# Patient Record
Sex: Female | Born: 1960 | Race: White | Hispanic: No | Marital: Married | Smoking: Former smoker
Health system: Southern US, Community
[De-identification: ages and names within clinical notes are randomized; demographics above are authoritative.]

## PROBLEM LIST (undated history)

## (undated) DIAGNOSIS — F419 Anxiety disorder, unspecified: Secondary | ICD-10-CM

## (undated) DIAGNOSIS — F329 Major depressive disorder, single episode, unspecified: Secondary | ICD-10-CM

## (undated) DIAGNOSIS — F32A Depression, unspecified: Secondary | ICD-10-CM

## (undated) DIAGNOSIS — E785 Hyperlipidemia, unspecified: Secondary | ICD-10-CM

## (undated) HISTORY — DX: Major depressive disorder, single episode, unspecified: F32.9

## (undated) HISTORY — DX: Anxiety disorder, unspecified: F41.9

## (undated) HISTORY — PX: OTHER SURGICAL HISTORY: SHX169

## (undated) HISTORY — DX: Depression, unspecified: F32.A

## (undated) HISTORY — PX: LIPOSUCTION: SHX10

## (undated) HISTORY — DX: Hyperlipidemia, unspecified: E78.5

## (undated) HISTORY — PX: MASTOPEXY: SUR857

---

## 2003-02-04 HISTORY — PX: OTHER SURGICAL HISTORY: SHX169

## 2004-02-04 HISTORY — PX: BLEPHAROPLASTY: SUR158

## 2009-10-20 ENCOUNTER — Emergency Department (HOSPITAL_COMMUNITY): Admission: EM | Admit: 2009-10-20 | Discharge: 2009-10-20 | Payer: Self-pay | Admitting: Family Medicine

## 2012-02-04 HISTORY — PX: COLONOSCOPY: SHX5424

## 2012-06-02 ENCOUNTER — Other Ambulatory Visit: Payer: Self-pay | Admitting: Family Medicine

## 2012-09-14 ENCOUNTER — Telehealth: Payer: Self-pay | Admitting: Physician Assistant

## 2012-09-14 NOTE — Telephone Encounter (Signed)
Called patient.  States due for colonoscopy and wanted to know who we recommend.  Told multiple GI offices in area and associated with [facility] that we can refer to.  Also discussed large cyst on side and who could remove that.  Again told good surgeons in area to refer to.  BUT she would need a referral and we would have to sent recent notes/lab work to both to schedule those appts.  Pt to call back and schedule appt when available.

## 2012-09-22 ENCOUNTER — Ambulatory Visit (INDEPENDENT_AMBULATORY_CARE_PROVIDER_SITE_OTHER): Payer: Managed Care, Other (non HMO) | Admitting: Physician Assistant

## 2012-09-22 ENCOUNTER — Encounter: Payer: Self-pay | Admitting: Physician Assistant

## 2012-09-22 ENCOUNTER — Ambulatory Visit
Admission: RE | Admit: 2012-09-22 | Discharge: 2012-09-22 | Disposition: A | Discharge status: Home / Self Care | Payer: Managed Care, Other (non HMO) | Source: Ambulatory Visit | Attending: Physician Assistant | Admitting: Physician Assistant

## 2012-09-22 VITALS — BP 122/90 | HR 76 | Temp 98.3°F | Resp 18 | Ht 63.0 in | Wt 120.0 lb

## 2012-09-22 DIAGNOSIS — M545 Low back pain: Secondary | ICD-10-CM

## 2012-09-22 DIAGNOSIS — L720 Epidermal cyst: Secondary | ICD-10-CM

## 2012-09-22 DIAGNOSIS — Z1211 Encounter for screening for malignant neoplasm of colon: Secondary | ICD-10-CM

## 2012-09-22 DIAGNOSIS — L723 Sebaceous cyst: Secondary | ICD-10-CM

## 2012-09-22 DIAGNOSIS — F329 Major depressive disorder, single episode, unspecified: Secondary | ICD-10-CM | POA: Insufficient documentation

## 2012-09-22 NOTE — Progress Notes (Signed)
Patient ID: Kelly Joseph MRN: [id number], DOB: 14-Jun-1960, 52 y.o. Date of Encounter: 09/22/2012, 2:39 PM    Chief Complaint: 52  Chief Complaint  Patient presents with  . due colonoscopy, knot on left side    back pain "alot of popping"  has had but getting worse     HPI: 52 y.o. year old white female here to evaluate several issues.  #1-she reports that she has had a history of spasms in her low back for many years. She has used Flexeril as needed for these over the years. However she is concerned because these spasms are occurring more frequently recently. As well they seem to be occurring for no known cause. She says this past Sunday she was walking into church and all of a sudden had a grabbing tight sensation in her both sides of her low back. She took a Flexeril and about 30 minutes later the spasm had eased off. She says she does aerobics video at home as her exercise. No other type of exercise. However she says that she does not have any significant back pain during the time of the exercise. As well she says that she can be cleaning the house crawling around wiping the molding Melvern Banker. She has no problems with back pain her back spasms with this. She has had no pain no numbness no tingling no weakness going down either leg.  2- at her last physical I told her that when she turned 52 she would be due for colonoscopy. She is now 52 she is interested in scheduling for this.  3- she has had a cyst under the skin on her left flank for many years. This is starting to bother her and she wants to have this removed.   Home Meds: See attached medication section for any medications that were entered at today's visit. The computer does not put those onto this list.The following list is a list of meds entered prior to today's visit.   Current Outpatient Prescriptions on File Prior to Visit  Medication Sig Dispense Refill  . fluticasone (FLONASE) 50 MCG/ACT nasal spray USE 1 SPRAY IN EACH  NOSTRIL EVERY DAY  16 g  11   No current facility-administered medications on file prior to visit.    Allergies:  Allergies  Allergen Reactions  . Latex Hives  . Sulfa Antibiotics Hives      Review of Systems: See HPI for pertinent ROS. All other ROS negative.    Physical Exam: Blood pressure 122/90, pulse 76, temperature 98.3 F (36.8 C), temperature source Oral, resp. rate 18, height 5\' 3"  (1.6 m), weight 120 lb (54.432 kg)., Body mass index is 21.26 kg/(m^2). General: well-nourished well-developed white female. She has had multiple plastic surgeries and works as a Associate Professor. Appears in no acute distress. Neck: Supple. No thyromegaly. No lymphadenopathy. Lungs: Clear bilaterally to auscultation without wheezes, rales, or rhonchi. Breathing is unlabored. Heart: Regular rhythm. No murmurs, rubs, or gallops. Msk:  Strength and tone normal for age. Back: She points to bilateral lower back at approximate L4-L5 level as the area of her pain and spasms. There is only mild pain with palpation. Bilateral straight leg raise and hip abductions are normal. 2+ equal bilateral patellar reflexes. Skin: Warm and dry. Left flank at the level of the lower rib cage: There is an approximate 1 inch diameter cyst under the skin. There is no erythema to suggest infection or inflammation. Neuro: Alert and oriented X 3. Moves all extremities spontaneously. Gait  is normal. CNII-XII grossly in tact. Psych:  Responds to questions appropriately with a normal affect.     ASSESSMENT AND PLAN:  52 y.o. year old female with  1. Colon cancer screening - Ambulatory referral to Gastroenterology  2. Low back pain Will obtain x-ray to rule out pathology. Otherwise continue using Flexeril when necessary back spasms. She is to stretch with her exercise. - DG Lumbar Spine Complete; Future  3. Epidermoid cyst of skin Will refer for excision.   Murray Hodgkins [place], [place], [facility] 09/22/2012 2:39 PM

## 2012-09-23 ENCOUNTER — Encounter: Payer: Self-pay | Admitting: Internal Medicine

## 2012-09-29 ENCOUNTER — Ambulatory Visit (INDEPENDENT_AMBULATORY_CARE_PROVIDER_SITE_OTHER): Payer: Managed Care, Other (non HMO) | Admitting: General Surgery

## 2012-10-14 ENCOUNTER — Ambulatory Visit (INDEPENDENT_AMBULATORY_CARE_PROVIDER_SITE_OTHER): Payer: Managed Care, Other (non HMO) | Admitting: General Surgery

## 2012-10-14 ENCOUNTER — Encounter (INDEPENDENT_AMBULATORY_CARE_PROVIDER_SITE_OTHER): Payer: Self-pay | Admitting: General Surgery

## 2012-10-14 VITALS — BP 124/70 | HR 70 | Temp 98.0°F | Resp 18 | Ht 63.0 in | Wt 118.0 lb

## 2012-10-14 DIAGNOSIS — D179 Benign lipomatous neoplasm, unspecified: Secondary | ICD-10-CM

## 2012-10-14 NOTE — Patient Instructions (Addendum)

## 2012-10-14 NOTE — Progress Notes (Signed)
Chief complaint: Enlarging lump left flank  History: Patient is a pleasant 52 year old female referred by Allayne Butcher PA for an enlarging lump on her left flank. She states this has been present for several years but has been gradually enlarging. It is now causing some occasional mild discomfort. She has no other similar areas. She's never had surgery on this.  Past Medical History  Diagnosis Date  . Anxiety   . Depression    Past Surgical History  Procedure Laterality Date  . Mastopexy  1989/1999  . Liposuction  1999/2009  . Blepharoplasty  2006  . Resty    . Restylane  2005   Current Outpatient Prescriptions  Medication Sig Dispense Refill  . ALPRAZolam (XANAX) 0.25 MG tablet Take 0.25 mg by mouth daily as needed for anxiety.      Marland Kitchen buPROPion (WELLBUTRIN XL) 150 MG 24 hr tablet Take 1 tablet by mouth daily. Takes with 300mg  to equal 450mg  a day      . buPROPion (WELLBUTRIN XL) 300 MG 24 hr tablet Take 300 mg by mouth daily. Takes with 150 to equal 450mg  daily      . cetirizine (ZYRTEC) 10 MG tablet Take 10 mg by mouth daily.      . cyclobenzaprine (FLEXERIL) 10 MG tablet Take 10 mg by mouth 3 (three) times daily as needed for muscle spasms.      . Flaxseed, Linseed, (FLAX SEEDS PO) Take by mouth.      . fluticasone (FLONASE) 50 MCG/ACT nasal spray USE 1 SPRAY IN EACH NOSTRIL EVERY DAY  16 g  11  . Multiple Vitamin (MULTIVITAMIN) tablet Take 1 tablet by mouth daily.      Marland Kitchen omega-3 acid ethyl esters (LOVAZA) 1 G capsule Take 2 g by mouth 2 (two) times daily.       No current facility-administered medications for this visit.   Allergies  Allergen Reactions  . Latex Hives  . Sulfa Antibiotics Hives   History  Substance Use Topics  . Smoking status: Former Smoker    Quit date: 09/23/1982  . Smokeless tobacco: Never Used  . Alcohol Use: No   Exam: BP 124/70  Pulse 70  Temp(Src) 98 F (36.7 C)  Resp 18  Ht 5\' 3"  (1.6 m)  Wt 118 lb (53.524 kg)  BMI 20.91 kg/m2 General:  Thin Caucasian female in no distress Skin: No rash or infection Lungs: Clear breath sounds bilaterally Chest wall: On the left lateral lower chest wall is a 4 x 2 cm discrete fleshy freely movable soft tissue mass consistent with lipoma. Cardiac: Regular rate and rhythm no murmurs Extremities: No edema or joint swelling Neurologic: Alert and fully oriented. Affect normal. Gait normal.  Assessment and plan: Enlarging symptomatic lipoma left flank. I recommended removal under local anesthesia with sedation as an outpatient. We discussed the nature of the procedure and indications as well as risks of bleeding, infection, anesthetic complications, and recurrence. All her questions were answered. We will schedule this at her convenience.

## 2012-11-02 ENCOUNTER — Encounter (INDEPENDENT_AMBULATORY_CARE_PROVIDER_SITE_OTHER): Payer: Self-pay

## 2012-11-26 ENCOUNTER — Ambulatory Visit (AMBULATORY_SURGERY_CENTER): Payer: Self-pay

## 2012-11-26 VITALS — Ht 64.0 in | Wt 125.0 lb

## 2012-11-26 DIAGNOSIS — Z1211 Encounter for screening for malignant neoplasm of colon: Secondary | ICD-10-CM

## 2012-11-26 MED ORDER — SUPREP BOWEL PREP KIT 17.5-3.13-1.6 GM/177ML PO SOLN: 1.0000 | Freq: Once | ORAL | Status: DC

## 2012-12-01 ENCOUNTER — Encounter: Payer: Self-pay | Admitting: Internal Medicine

## 2012-12-10 ENCOUNTER — Ambulatory Visit (AMBULATORY_SURGERY_CENTER): Payer: Managed Care, Other (non HMO) | Admitting: Internal Medicine

## 2012-12-10 ENCOUNTER — Encounter: Payer: Self-pay | Admitting: Internal Medicine

## 2012-12-10 VITALS — BP 126/79 | HR 63 | Temp 95.7°F | Resp 14 | Ht 64.0 in | Wt 125.0 lb

## 2012-12-10 DIAGNOSIS — Z1211 Encounter for screening for malignant neoplasm of colon: Secondary | ICD-10-CM

## 2012-12-10 MED ORDER — SODIUM CHLORIDE 0.9 % IV SOLN: 500.0000 mL | INTRAVENOUS | Status: DC

## 2012-12-10 NOTE — Progress Notes (Signed)
Report to pacu rn, vss, bbs=clear 

## 2012-12-10 NOTE — Progress Notes (Signed)
Patient did not have preoperative order for IV antibiotic SSI prophylaxis. (G8918)  Patient did not experience any of the following events: a burn prior to discharge; a fall within the facility; wrong site/side/patient/procedure/implant event; or a hospital transfer or hospital admission upon discharge from the facility. (G8907)  

## 2012-12-10 NOTE — Patient Instructions (Addendum)
Your colon was normal and the prep was great!  Next routine colonoscopy in 10 years - 2024.  It is the time of year to have a vaccination to prevent the flu (influenza virus). Please have this done through your primary care provider or you can get this done at local pharmacies or the [facility]. It would be very helpful if you notify your primary care provider when and where you had the vaccination given by messaging them in My Chart, leaving a message or faxing the information.  I appreciate the opportunity to care for you. Iva Boop, MD, FACG  YOU HAD AN ENDOSCOPIC PROCEDURE TODAY AT THE [facility]: Refer to the procedure report that was given to you for any specific questions about what was found during the examination.  If the procedure report does not answer your questions, please call your gastroenterologist to clarify.  If you requested that your care partner not be given the details of your procedure findings, then the procedure report has been included in a sealed envelope for you to review at your convenience later.  YOU SHOULD EXPECT: Some feelings of bloating in the abdomen. Passage of more gas than usual.  Walking can help get rid of the air that was put into your GI tract during the procedure and reduce the bloating. If you had a lower endoscopy (such as a colonoscopy or flexible sigmoidoscopy) you may notice spotting of blood in your stool or on the toilet paper. If you underwent a bowel prep for your procedure, then you may not have a normal bowel movement for a few days.  DIET: Your first meal following the procedure should be a light meal and then it is ok to progress to your normal diet.  A half-sandwich or bowl of soup is an example of a good first meal.  Heavy or fried foods are harder to digest and may make you feel nauseous or bloated.  Likewise meals heavy in dairy and vegetables can cause extra gas to form and this can also increase the bloating.  Drink  plenty of fluids but you should avoid alcoholic beverages for 24 hours.  ACTIVITY: Your care partner should take you home directly after the procedure.  You should plan to take it easy, moving slowly for the rest of the day.  You can resume normal activity the day after the procedure however you should NOT DRIVE or use heavy machinery for 24 hours (because of the sedation medicines used during the test).    SYMPTOMS TO REPORT IMMEDIATELY: A gastroenterologist can be reached at any hour.  During normal business hours, 8:30 AM to 5:00 PM Monday through Friday, call [phone].  After hours and on weekends, please call the GI answering service at [phone] who will take a message and have the physician on call contact you.   Following lower endoscopy (colonoscopy or flexible sigmoidoscopy):  Excessive amounts of blood in the stool  Significant tenderness or worsening of abdominal pains  Swelling of the abdomen that is new, acute  Fever of 100F or higher  FOLLOW UP: If any biopsies were taken you will be contacted by phone or by letter within the next 1-3 weeks.  Call your gastroenterologist if you have not heard about the biopsies in 3 weeks.  Our staff will call the home number listed on your records the next business day following your procedure to check on you and address any questions or concerns that you may have  at that time regarding the information given to you following your procedure. This is a courtesy call and so if there is no answer at the home number and we have not heard from you through the emergency physician on call, we will assume that you have returned to your regular daily activities without incident.  SIGNATURES/CONFIDENTIALITY: You and/or your care partner have signed paperwork which will be entered into your electronic medical record.  These signatures attest to the fact that that the information above on your After Visit Summary has been reviewed and is understood.   Full responsibility of the confidentiality of this discharge information lies with you and/or your care-partner.

## 2012-12-10 NOTE — Op Note (Signed)
[facility] [address] [place], [address]   COLONOSCOPY PROCEDURE REPORT  PATIENT: Kelly Joseph, Kelly Joseph  MR#: [id number] BIRTHDATE: 1960-03-14 , 52  yrs. old GENDER: Female ENDOSCOPIST: Iva Boop, MD, [facility] REFERRED [id number] Dionicio Stall, PA-C PROCEDURE DATE:  12/10/2012 PROCEDURE:   Colonoscopy, screening First Screening Colonoscopy - Avg.  risk and is 50 yrs.  old or older - No.  Prior Negative Screening - Now for repeat screening. N/A  History of Adenoma - Now for follow-up colonoscopy & has been > or = to 3 yrs.  N/A  Polyps Removed Today? No.  Recommend repeat exam, <10 yrs? No. ASA CLASS:   Class II INDICATIONS:average risk screening and first colonoscopy. MEDICATIONS: propofol (Diprivan) 300mg  IV, MAC sedation, administered by CRNA, and These medications were titrated to patient response per physician's verbal order  DESCRIPTION OF PROCEDURE:   After the risks benefits and alternatives of the procedure were thoroughly explained, informed consent was obtained.  A digital rectal exam revealed no abnormalities of the rectum.   The LB [id number]  endoscope was introduced through the anus and advanced to the cecum, which was identified by both the appendix and ileocecal valve. No adverse events experienced.   The quality of the prep was excellent using Suprep  The instrument was then slowly withdrawn as the colon was fully examined.      COLON FINDINGS: A normal appearing cecum, ileocecal valve, and appendiceal orifice were identified.  The ascending, hepatic flexure, transverse, splenic flexure, descending, sigmoid colon and rectum appeared unremarkable.  No polyps or cancers were seen. Retroflexed views revealed no abnormalities. The time to cecum=2 minutes 34 seconds.  Withdrawal time=8 minutes 04 seconds.  The scope was withdrawn and the procedure completed. COMPLICATIONS: There were no complications.  ENDOSCOPIC IMPRESSION: Normal  colonoscopy- excellent prep - first colonoscopy  RECOMMENDATIONS: Repeat routine colonoscopy 10 years - 2024   eSigned:  Iva Boop, MD, [facility] 12/10/2012 9:07 AM   cc: Frazier Richards PA-C and The Patient

## 2012-12-13 ENCOUNTER — Telehealth: Payer: Self-pay

## 2012-12-13 NOTE — Telephone Encounter (Signed)
  Follow up Call-  Call back number 12/10/2012  Post procedure Call Back phone  # [phone]  Permission to leave phone message Yes     Patient questions:  Do you have a fever, pain , or abdominal swelling? no Pain Score  0 *  Have you tolerated food without any problems? yes  Have you been able to return to your normal activities? yes  Do you have any questions about your discharge instructions: Diet   no Medications  no Follow up visit  no  Do you have questions or concerns about your Care? no  Actions: * If pain score is 4 or above: No action needed, pain <4.

## 2013-01-30 ENCOUNTER — Other Ambulatory Visit: Payer: Self-pay | Admitting: Physician Assistant

## 2013-01-31 NOTE — Telephone Encounter (Signed)
Medication refilled per protocol. 

## 2013-02-01 ENCOUNTER — Telehealth: Payer: Self-pay | Admitting: Physician Assistant

## 2013-02-01 NOTE — Telephone Encounter (Signed)
Pt is needing a refill both Wellbutrin needs to be refilled Pharmacy [facility] Call back number is  [phone]

## 2013-02-01 NOTE — Telephone Encounter (Signed)
Med was refilled 12/28. Pharm called and confirmed

## 2013-02-14 ENCOUNTER — Other Ambulatory Visit: Payer: Managed Care, Other (non HMO)

## 2013-02-14 DIAGNOSIS — Z0000 Encounter for general adult medical examination without abnormal findings: Secondary | ICD-10-CM

## 2013-02-14 DIAGNOSIS — Z79899 Other long term (current) drug therapy: Secondary | ICD-10-CM

## 2013-02-14 DIAGNOSIS — E785 Hyperlipidemia, unspecified: Secondary | ICD-10-CM

## 2013-02-14 LAB — LIPID PANEL
Cholesterol: 268 mg/dL — ABNORMAL HIGH (ref 0–200)
HDL: 67 mg/dL (ref >39)
LDL CALC: 184 mg/dL — AB (ref 0–99)
Total CHOL/HDL Ratio: 4 Ratio
Triglycerides: 85 mg/dL (ref <150)
VLDL: 17 mg/dL (ref 0–40)

## 2013-02-14 LAB — COMPLETE METABOLIC PANEL WITH GFR
ALBUMIN: 4.3 g/dL (ref 3.5–5.2)
ALT: 14 U/L (ref 0–35)
AST: 14 U/L (ref 0–37)
Alkaline Phosphatase: 41 U/L (ref 39–117)
BUN: 10 mg/dL (ref 6–23)
CALCIUM: 9.3 mg/dL (ref 8.4–10.5)
CHLORIDE: 103 meq/L (ref 96–112)
CO2: 28 mEq/L (ref 19–32)
CREATININE: 0.78 mg/dL (ref 0.50–1.10)
GFR, Est African American: >89 mL/min
GFR, Est Non African American: 89 mL/min
Glucose, Bld: 90 mg/dL (ref 70–99)
POTASSIUM: 4.8 meq/L (ref 3.5–5.3)
Sodium: 140 mEq/L (ref 135–145)
Total Bilirubin: 0.4 mg/dL (ref 0.3–1.2)
Total Protein: 6.7 g/dL (ref 6.0–8.3)

## 2013-02-14 LAB — CBC WITH DIFFERENTIAL/PLATELET
Basophils Absolute: 0.1 10*3/uL (ref 0.0–0.1)
Basophils Relative: 1 % (ref 0–1)
Eosinophils Absolute: 0.3 10*3/uL (ref 0.0–0.7)
Eosinophils Relative: 5 % (ref 0–5)
HCT: 41.6 % (ref 36.0–46.0)
HEMOGLOBIN: 14.2 g/dL (ref 12.0–15.0)
LYMPHS ABS: 2.4 10*3/uL (ref 0.7–4.0)
LYMPHS PCT: 46 % (ref 12–46)
MCH: 30.1 pg (ref 26.0–34.0)
MCHC: 34.1 g/dL (ref 30.0–36.0)
MCV: 88.1 fL (ref 78.0–100.0)
MONOS PCT: 8 % (ref 3–12)
Monocytes Absolute: 0.4 10*3/uL (ref 0.1–1.0)
NEUTROS ABS: 2 10*3/uL (ref 1.7–7.7)
NEUTROS PCT: 40 % — AB (ref 43–77)
Platelets: 352 10*3/uL (ref 150–400)
RBC: 4.72 MIL/uL (ref 3.87–5.11)
RDW: 13.8 % (ref 11.5–15.5)
WBC: 5.1 10*3/uL (ref 4.0–10.5)

## 2013-02-14 LAB — TSH: TSH: 1.447 u[IU]/mL (ref 0.350–4.500)

## 2013-02-15 ENCOUNTER — Encounter: Payer: Managed Care, Other (non HMO) | Admitting: Physician Assistant

## 2013-02-15 LAB — VITAMIN D 25 HYDROXY (VIT D DEFICIENCY, FRACTURES): VIT D 25 HYDROXY: 41 ng/mL (ref 30–89)

## 2013-02-24 ENCOUNTER — Encounter: Payer: Managed Care, Other (non HMO) | Admitting: Physician Assistant

## 2013-02-28 ENCOUNTER — Encounter: Payer: Self-pay | Admitting: Family Medicine

## 2013-03-14 ENCOUNTER — Ambulatory Visit (INDEPENDENT_AMBULATORY_CARE_PROVIDER_SITE_OTHER): Payer: Managed Care, Other (non HMO) | Admitting: Physician Assistant

## 2013-03-14 ENCOUNTER — Other Ambulatory Visit: Payer: Self-pay | Admitting: Family Medicine

## 2013-03-14 ENCOUNTER — Encounter: Payer: Self-pay | Admitting: Physician Assistant

## 2013-03-14 VITALS — BP 124/72 | HR 76 | Temp 97.6°F | Resp 18 | Ht 63.0 in | Wt 122.0 lb

## 2013-03-14 DIAGNOSIS — M549 Dorsalgia, unspecified: Secondary | ICD-10-CM | POA: Insufficient documentation

## 2013-03-14 DIAGNOSIS — Z0000 Encounter for general adult medical examination without abnormal findings: Secondary | ICD-10-CM

## 2013-03-14 DIAGNOSIS — F329 Major depressive disorder, single episode, unspecified: Secondary | ICD-10-CM

## 2013-03-14 DIAGNOSIS — E785 Hyperlipidemia, unspecified: Secondary | ICD-10-CM | POA: Insufficient documentation

## 2013-03-14 DIAGNOSIS — F419 Anxiety disorder, unspecified: Secondary | ICD-10-CM

## 2013-03-14 DIAGNOSIS — J309 Allergic rhinitis, unspecified: Secondary | ICD-10-CM

## 2013-03-14 DIAGNOSIS — F32A Depression, unspecified: Secondary | ICD-10-CM

## 2013-03-14 DIAGNOSIS — F3289 Other specified depressive episodes: Secondary | ICD-10-CM

## 2013-03-14 DIAGNOSIS — F411 Generalized anxiety disorder: Secondary | ICD-10-CM

## 2013-03-14 HISTORY — DX: Hyperlipidemia, unspecified: E78.5

## 2013-03-14 MED ORDER — CYCLOBENZAPRINE HCL 10 MG PO TABS: 10.0000 mg | ORAL_TABLET | Freq: Three times a day (TID) | ORAL | Status: DC | PRN

## 2013-03-14 MED ORDER — SIMVASTATIN 10 MG PO TABS: 10.0000 mg | ORAL_TABLET | Freq: Every day | ORAL | Status: DC

## 2013-03-14 MED ORDER — BUPROPION HCL ER (XL) 300 MG PO TB24: 300.0000 mg | ORAL_TABLET | Freq: Every day | ORAL | Status: DC

## 2013-03-14 MED ORDER — BUPROPION HCL ER (XL) 150 MG PO TB24: 150.0000 mg | ORAL_TABLET | Freq: Every day | ORAL | Status: DC

## 2013-03-14 MED ORDER — FLUTICASONE PROPIONATE 50 MCG/ACT NA SUSP: 2.0000 | Freq: Every day | NASAL | Status: DC

## 2013-03-14 NOTE — Telephone Encounter (Signed)
Medication refilled per protocol. 

## 2013-03-14 NOTE — Progress Notes (Signed)
Patient ID: Floetta Wells-Fowler MRN: [id number], DOB: 1960/12/03, 53 y.o. Date of Encounter: 03/14/2013,   Chief Complaint: Physical (CPE)  HPI: 53 y.o. y/o female  here for CPE.   When She was here last year for her physical she requested that we increase her Wellbutrin dose as she was often feeling overwhelmed. She says that this has worked very well for her. Feeling much better with this increased dose. Having no adverse effects. Rarely needs the Xanax but does like to have it on hand for when needed.  As well would like a refill on her Flexeril to still have available as needed for some back pain that is secondary to muscle strain.  She also continues to use Zyrtec and Flonase for allergies.  He continues to exercise 5 or 6 days per week at home. She does different types of videos for aerobics and does some weight lifting as well. As well she is very cautious about her diet and eats a very healthy diet. Has no angina symptoms with her exercise.   Review of Systems: Consitutional: No fever, chills, fatigue, night sweats, lymphadenopathy. No significant/unexplained weight changes. Eyes: No visual changes, eye redness, or discharge. ENT/Mouth: No ear pain, sore throat, nasal drainage, or sinus pain. Cardiovascular: No chest pressure,heaviness, tightness or squeezing, even with exertion. No increased shortness of breath or dyspnea on exertion.No palpitations, edema, orthopnea, PND. Respiratory: No cough, hemoptysis, SOB, or wheezing. Gastrointestinal: No anorexia, dysphagia, reflux, pain, nausea, vomiting, hematemesis, diarrhea, constipation, BRBPR, or melena. Breast: No mass, nodules, bulging, or retraction. No skin changes or inflammation. No nipple discharge. No lymphadenopathy. Genitourinary: No dysuria, hematuria, incontinence, vaginal discharge, pruritis, burning, abnormal bleeding, or pain. Musculoskeletal: No decreased ROM, No joint pain or swelling. No significant pain in neck,  back, or extremities. Skin: No rash, pruritis, or concerning lesions. Neurological: No headache, dizziness, syncope, seizures, tremors, memory loss, coordination problems, or paresthesias. Psychological: No anxiety, depression, hallucinations, SI/HI. Endocrine: No polydipsia, polyphagia, polyuria, or known diabetes.No increased fatigue. No palpitations/rapid heart rate. No significant/unexplained weight change. All other systems were reviewed and are otherwise negative.  Past Medical History  Diagnosis Date  . Anxiety   . Depression   . Hyperlipidemia 03/14/2013     Past Surgical History  Procedure Laterality Date  . Mastopexy  1989/1999  . Liposuction  1999/2009  . Blepharoplasty  2006  . Resty    . Restylane  2005    Home Meds:  Current Outpatient Prescriptions on File Prior to Visit  Medication Sig Dispense Refill  . ALPRAZolam (XANAX) 0.25 MG tablet Take 0.25 mg by mouth daily as needed for anxiety.      . cetirizine (ZYRTEC) 10 MG tablet Take 10 mg by mouth daily.      . Flaxseed, Linseed, (FLAX SEEDS PO) Take by mouth.      . Multiple Vitamin (MULTIVITAMIN) tablet Take 1 tablet by mouth daily.      Marland Kitchen omega-3 acid ethyl esters (LOVAZA) 1 G capsule Take 2 g by mouth 2 (two) times daily.       No current facility-administered medications on file prior to visit.    Allergies:  Allergies  Allergen Reactions  . Latex Hives  . Sulfa Antibiotics Hives    History   Social History  . Marital Status: Married    Spouse Name: N/A    Number of Children: N/A  . Years of Education: N/A   Occupational History  . Not on file.   Social History  Main Topics  . Smoking status: Former Smoker    Quit date: 09/23/1982  . Smokeless tobacco: Never Used  . Alcohol Use: Yes     Comment: socialy  . Drug Use: No  . Sexual Activity: Not on file   Other Topics Concern  . Not on file   Social History Narrative   Cosmetologist.   Exercises at home 5-6 days per week   No children      Family History  Problem Relation Age of Onset  . Cancer Mother     breast ca  . Cancer Maternal Grandfather     ovarian ca  . Colon cancer Neg Hx   . Pancreatic cancer Neg Hx   . Stomach cancer Neg Hx     Physical Exam: Blood pressure 124/72, pulse 76, temperature 97.6 F (36.4 C), temperature source Oral, resp. rate 18, height _0  (1.6 m), weight 122 lb (55.339 kg)., Body mass index is 21.62 kg/(m^2). General: Well developed, well nourished,WF. Appears in no acute distress. HEENT: Normocephalic, atraumatic. Conjunctiva pink, sclera non-icteric. Pupils 2 mm constricting to 1 mm, round, regular, and equally reactive to light and accomodation. EOMI. Internal auditory canal clear. TMs with good cone of light and without pathology. Nasal mucosa pink. Nares are without discharge. No sinus tenderness. Oral mucosa pink.  Pharynx without exudate.   Neck: Supple. Trachea midline. No thyromegaly. Full ROM. No lymphadenopathy.No Carotid Bruits. Lungs: Clear to auscultation bilaterally without wheezes, rales, or rhonchi. Breathing is of normal effort and unlabored. Cardiovascular: RRR with S1 S2. No murmurs, rubs, or gallops. Distal pulses 2+ symmetrically. No carotid or abdominal bruits. Breast: Symmetrical. No masses. Nipples without discharge. Abdomen: Soft, non-tender, non-distended with normoactive bowel sounds. No hepatosplenomegaly or masses. No rebound/guarding. No CVA tenderness. No hernias.  Genitourinary:  External genitalia without lesions. Vaginal mucosa pink.No discharge present. Cervix pink and without discharge. No cervical tenderness.Normal uterus size. No adnexal mass or tenderness.  Musculoskeletal: Full range of motion and 5/5 strength throughout. Without swelling, atrophy, tenderness, crepitus, or warmth. Extremities without clubbing, cyanosis, or edema. Calves supple. Skin: Warm and moist without erythema, ecchymosis, wounds, or rash. Neuro: A+Ox3. CN II-XII grossly intact.  Moves all extremities spontaneously. Full sensation throughout. Normal gait. DTR 2+ throughout upper and lower extremities. Finger to nose intact. Psych:  Responds to questions appropriately with a normal affect.   Assessment/Plan:  53 y.o. y/o female here for CPE 1. Encounter for preventive health examination   A. Screening Labs: Results for orders placed in visit on 02/14/13  LIPID PANEL      Result Value Range   Cholesterol 268 (*) 0 - 200 mg/dL   Triglycerides 85  <150 mg/dL   HDL 67  >39 mg/dL   Total CHOL/HDL Ratio 4.0     VLDL 17  0 - 40 mg/dL   LDL Cholesterol 184 (*) 0 - 99 mg/dL  CBC WITH DIFFERENTIAL      Result Value Range   WBC 5.1  4.0 - 10.5 K/uL   RBC 4.72  3.87 - 5.11 MIL/uL   Hemoglobin 14.2  12.0 - 15.0 g/dL   HCT 41.6  36.0 - 46.0 %   MCV 88.1  78.0 - 100.0 fL   MCH 30.1  26.0 - 34.0 pg   MCHC 34.1  30.0 - 36.0 g/dL   RDW 13.8  11.5 - 15.5 %   Platelets 352  150 - 400 K/uL   Neutrophils Relative % 40 (*) 43 - 77 %  Neutro Abs 2.0  1.7 - 7.7 K/uL   Lymphocytes Relative 46  12 - 46 %   Lymphs Abs 2.4  0.7 - 4.0 K/uL   Monocytes Relative 8  3 - 12 %   Monocytes Absolute 0.4  0.1 - 1.0 K/uL   Eosinophils Relative 5  0 - 5 %   Eosinophils Absolute 0.3  0.0 - 0.7 K/uL   Basophils Relative 1  0 - 1 %   Basophils Absolute 0.1  0.0 - 0.1 K/uL   Smear Review Criteria for review not met    VITAMIN D 25 HYDROXY      Result Value Range   Vit D, 25-Hydroxy 41  30 - 89 ng/mL  TSH      Result Value Range   TSH 1.447  0.350 - 4.500 uIU/mL  COMPLETE METABOLIC PANEL WITH GFR      Result Value Range   Sodium 140  135 - 145 mEq/L   Potassium 4.8  3.5 - 5.3 mEq/L   Chloride 103  96 - 112 mEq/L   CO2 28  19 - 32 mEq/L   Glucose, Bld 90  70 - 99 mg/dL   BUN 10  6 - 23 mg/dL   Creat 0.78  0.50 - 1.10 mg/dL   Total Bilirubin 0.4  0.3 - 1.2 mg/dL   Alkaline Phosphatase 41  39 - 117 U/L   AST 14  0 - 37 U/L   ALT 14  0 - 35 U/L   Total Protein 6.7  6.0 - 8.3 g/dL     Albumin 4.3  3.5 - 5.2 g/dL   Calcium 9.3  8.4 - 10.5 mg/dL   GFR, Est African American >89     GFR, Est Non African American 89       B. Pap: Last Pap smear was performed 05/21/2011. Both cytology and HPV co testing were negative. Therefore can wait 5 years to repeat. Due 05/2016.  C. Screening Mammogram: She states that she always has her mammogram at the same time as her physical. She will go home today and call and scheduled for followup mammogram.   D. Colorectal Cancer Screening: She is now age 78. She says that she is already gone for screening colonoscopy recently. Says it was completely normal and can wait 10 years to repeat.  F. Immunizations:  Tetanus: States that she had this performed on 10/14/2009 Zostavax: We'll discuss at age 69 Pneumonia Vaccine: We'll discuss at age 61    2. Anxiety Well controlled on current medication.  3. Depression Well controlled on current medication.  4. Back pain She uses Flexeril rarely but wants to have this on hand in case she needs it  5. Allergic rhinitis For the Zyrtec and Flonase  6. Hyperlipidemia Discussed her lipid panel that is above. She has no cardiac risk factors. However she says it would be impossible for her to improve her diet and exercise any further than what she is doing. LDL is 184. She is agreeable to start low-dose statin. Will come for fasting labs in 6 weeks to f/u - simvastatin (ZOCOR) 10 MG tablet; Take 1 tablet (10 mg total) by mouth at bedtime.  Dispense: 30 tablet; Refill: 1 - Lipid panel; Future - Hepatic function panel; Future   Signed, Olean Ree Ardoch, [place], [facility] 03/14/2013 11:43 AM

## 2013-03-17 ENCOUNTER — Encounter: Payer: Managed Care, Other (non HMO) | Admitting: Physician Assistant

## 2013-03-18 ENCOUNTER — Other Ambulatory Visit: Payer: Self-pay | Admitting: Physician Assistant

## 2013-03-18 DIAGNOSIS — F419 Anxiety disorder, unspecified: Secondary | ICD-10-CM

## 2013-03-18 NOTE — Telephone Encounter (Signed)
Last office visit 03/14/2013. Last refill given 09/2012.

## 2013-03-18 NOTE — Telephone Encounter (Signed)
Approved for #30+ one additional refill 

## 2013-03-18 NOTE — Telephone Encounter (Signed)
Rx called in 

## 2013-04-08 LAB — HM MAMMOGRAPHY

## 2013-04-27 ENCOUNTER — Encounter: Payer: Self-pay | Admitting: Family Medicine

## 2013-05-03 ENCOUNTER — Other Ambulatory Visit: Payer: Managed Care, Other (non HMO)

## 2013-05-03 DIAGNOSIS — E785 Hyperlipidemia, unspecified: Secondary | ICD-10-CM

## 2013-05-03 LAB — HEPATIC FUNCTION PANEL
ALBUMIN: 4 g/dL (ref 3.5–5.2)
ALT: 14 U/L (ref 0–35)
AST: 14 U/L (ref 0–37)
Alkaline Phosphatase: 38 U/L — ABNORMAL LOW (ref 39–117)
BILIRUBIN DIRECT: 0.1 mg/dL (ref 0.0–0.3)
Indirect Bilirubin: 0.2 mg/dL (ref 0.2–1.2)
TOTAL PROTEIN: 6.2 g/dL (ref 6.0–8.3)
Total Bilirubin: 0.3 mg/dL (ref 0.2–1.2)

## 2013-05-03 LAB — LIPID PANEL
CHOL/HDL RATIO: 3.7 ratio
CHOLESTEROL: 235 mg/dL — AB (ref 0–200)
HDL: 63 mg/dL (ref >39)
LDL Cholesterol: 153 mg/dL — ABNORMAL HIGH (ref 0–99)
Triglycerides: 96 mg/dL (ref <150)
VLDL: 19 mg/dL (ref 0–40)

## 2013-05-04 ENCOUNTER — Encounter: Payer: Self-pay | Admitting: Family Medicine

## 2013-06-01 ENCOUNTER — Emergency Department (HOSPITAL_COMMUNITY): Payer: Managed Care, Other (non HMO)

## 2013-06-01 ENCOUNTER — Emergency Department (HOSPITAL_COMMUNITY)
Admission: EM | Admit: 2013-06-01 | Discharge: 2013-06-01 | Disposition: A | Discharge status: Home / Self Care | Payer: Managed Care, Other (non HMO) | Attending: Emergency Medicine | Admitting: Emergency Medicine

## 2013-06-01 ENCOUNTER — Encounter (HOSPITAL_COMMUNITY): Payer: Self-pay | Admitting: Emergency Medicine

## 2013-06-01 DIAGNOSIS — F411 Generalized anxiety disorder: Secondary | ICD-10-CM | POA: Insufficient documentation

## 2013-06-01 DIAGNOSIS — R0682 Tachypnea, not elsewhere classified: Secondary | ICD-10-CM | POA: Insufficient documentation

## 2013-06-01 DIAGNOSIS — E876 Hypokalemia: Secondary | ICD-10-CM | POA: Insufficient documentation

## 2013-06-01 DIAGNOSIS — IMO0002 Reserved for concepts with insufficient information to code with codable children: Secondary | ICD-10-CM | POA: Insufficient documentation

## 2013-06-01 DIAGNOSIS — F410 Panic disorder [episodic paroxysmal anxiety] without agoraphobia: Secondary | ICD-10-CM

## 2013-06-01 DIAGNOSIS — Z79899 Other long term (current) drug therapy: Secondary | ICD-10-CM | POA: Insufficient documentation

## 2013-06-01 DIAGNOSIS — F3289 Other specified depressive episodes: Secondary | ICD-10-CM | POA: Insufficient documentation

## 2013-06-01 DIAGNOSIS — Z87891 Personal history of nicotine dependence: Secondary | ICD-10-CM | POA: Insufficient documentation

## 2013-06-01 DIAGNOSIS — Z91040 Latex allergy status: Secondary | ICD-10-CM | POA: Insufficient documentation

## 2013-06-01 DIAGNOSIS — F329 Major depressive disorder, single episode, unspecified: Secondary | ICD-10-CM | POA: Insufficient documentation

## 2013-06-01 DIAGNOSIS — E785 Hyperlipidemia, unspecified: Secondary | ICD-10-CM | POA: Insufficient documentation

## 2013-06-01 LAB — CBC WITH DIFFERENTIAL/PLATELET
Basophils Absolute: 0 10*3/uL (ref 0.0–0.1)
Basophils Relative: 0 % (ref 0–1)
Eosinophils Absolute: 0.1 10*3/uL (ref 0.0–0.7)
Eosinophils Relative: 1 % (ref 0–5)
HCT: 38.2 % (ref 36.0–46.0)
Hemoglobin: 13.5 g/dL (ref 12.0–15.0)
LYMPHS ABS: 3.4 10*3/uL (ref 0.7–4.0)
LYMPHS PCT: 40 % (ref 12–46)
MCH: 30.1 pg (ref 26.0–34.0)
MCHC: 35.3 g/dL (ref 30.0–36.0)
MCV: 85.3 fL (ref 78.0–100.0)
Monocytes Absolute: 0.6 10*3/uL (ref 0.1–1.0)
Monocytes Relative: 7 % (ref 3–12)
NEUTROS ABS: 4.5 10*3/uL (ref 1.7–7.7)
NEUTROS PCT: 52 % (ref 43–77)
PLATELETS: 355 10*3/uL (ref 150–400)
RBC: 4.48 MIL/uL (ref 3.87–5.11)
RDW: 13 % (ref 11.5–15.5)
WBC: 8.7 10*3/uL (ref 4.0–10.5)

## 2013-06-01 LAB — BASIC METABOLIC PANEL
BUN: 9 mg/dL (ref 6–23)
CALCIUM: 10.1 mg/dL (ref 8.4–10.5)
CO2: 20 mEq/L (ref 19–32)
CREATININE: 0.68 mg/dL (ref 0.50–1.10)
Chloride: 99 mEq/L (ref 96–112)
GFR calc Af Amer: >90 mL/min (ref >90)
GFR calc non Af Amer: >90 mL/min (ref >90)
Glucose, Bld: 91 mg/dL (ref 70–99)
Potassium: 3.2 mEq/L — ABNORMAL LOW (ref 3.7–5.3)
SODIUM: 138 meq/L (ref 137–147)

## 2013-06-01 LAB — TROPONIN I: Troponin I: <0.3 ng/mL (ref <0.30)

## 2013-06-01 MED ORDER — POTASSIUM CHLORIDE CRYS ER 20 MEQ PO TBCR: 20.0000 meq | EXTENDED_RELEASE_TABLET | Freq: Every day | ORAL | Status: AC

## 2013-06-01 MED ORDER — LORAZEPAM 2 MG/ML IJ SOLN
1.0000 mg | Freq: Once | INTRAMUSCULAR | Status: AC
  Administered 2013-06-01: 1 mg via INTRAVENOUS
  Filled 2013-06-01: qty 1

## 2013-06-01 MED ORDER — POTASSIUM CHLORIDE CRYS ER 20 MEQ PO TBCR
40.0000 meq | EXTENDED_RELEASE_TABLET | Freq: Once | ORAL | Status: AC
  Administered 2013-06-01: 40 meq via ORAL
  Filled 2013-06-01: qty 2

## 2013-06-01 NOTE — Discharge Instructions (Signed)
Please follow with your primary care doctor in the next 2 days for a check-up. They must obtain records for further management.   Do not hesitate to return to the Emergency Department for any new, worsening or concerning symptoms.    Panic Attacks Panic attacks are sudden, short-livedsurges of severe anxiety, fear, or discomfort. They may occur for no reason when you are relaxed, when you are anxious, or when you are sleeping. Panic attacks may occur for a number of reasons:   Healthy people occasionally have panic attacks in extreme, life-threatening situations, such as war or natural disasters. Normal anxiety is a protective mechanism of the body that helps [place] react to danger (fight or flight response).  Panic attacks are often seen with anxiety disorders, such as panic disorder, social anxiety disorder, generalized anxiety disorder, and phobias. Anxiety disorders cause excessive or uncontrollable anxiety. They may interfere with your relationships or other life activities.  Panic attacks are sometimes seen with other mental illnesses such as depression and posttraumatic stress disorder.  Certain medical conditions, prescription medicines, and drugs of abuse can cause panic attacks. SYMPTOMS  Panic attacks start suddenly, peak within 20 minutes, and are accompanied by four or more of the following symptoms:  Pounding heart or fast heart rate (palpitations).  Sweating.  Trembling or shaking.  Shortness of breath or feeling smothered.  Feeling choked.  Chest pain or discomfort.  Nausea or strange feeling in your stomach.  Dizziness, lightheadedness, or feeling like you will faint.  Chills or hot flushes.  Numbness or tingling in your lips or hands and feet.  Feeling that things are not real or feeling that you are not yourself.  Fear of losing control or going crazy.  Fear of dying. Some of these symptoms can mimic serious medical conditions. For example, you may think you  are having a heart attack. Although panic attacks can be very scary, they are not life threatening. DIAGNOSIS  Panic attacks are diagnosed through an assessment by your health care provider. Your health care provider will ask questions about your symptoms, such as where and when they occurred. Your health care provider will also ask about your medical history and use of alcohol and drugs, including prescription medicines. Your health care provider may order blood tests or other studies to rule out a serious medical condition. Your health care provider may refer you to a mental health professional for further evaluation. TREATMENT   Most healthy people who have one or two panic attacks in an extreme, life-threatening situation will not require treatment.  The treatment for panic attacks associated with anxiety disorders or other mental illness typically involves counseling with a mental health professional, medicine, or a combination of both. Your health care provider will help determine what treatment is best for you.  Panic attacks due to physical illness usually goes away with treatment of the illness. If prescription medicine is causing panic attacks, talk with your health care provider about stopping the medicine, decreasing the dose, or substituting another medicine.  Panic attacks due to alcohol or drug abuse goes away with abstinence. Some adults need professional help in order to stop drinking or using drugs. HOME CARE INSTRUCTIONS   Take all your medicines as prescribed.   Check with your health care provider before starting new prescription or over-the-counter medicines.  Keep all follow up appointments with your health care provider. SEEK MEDICAL CARE IF:  You are not able to take your medicines as prescribed.  Your symptoms do  not improve or get worse. SEEK IMMEDIATE MEDICAL CARE IF:   You experience panic attack symptoms that are different than your usual symptoms.  You have  serious thoughts about hurting yourself or others.  You are taking medicine for panic attacks and have a serious side effect. MAKE SURE YOU:  Understand these instructions.  Will watch your condition.  Will get help right away if you are not doing well or get worse. Document Released: 01/20/2005 Document Revised: 11/10/2012 Document Reviewed: 09/03/2012 [facility] Patient Information 2014 [place].  Hypokalemia Hypokalemia means that the amount of potassium in the blood is lower than normal.Potassium is a chemical, called an electrolyte, that helps regulate the amount of fluid in the body. It also stimulates muscle contraction and helps nerves function properly.Most of the body's potassium is inside of cells, and only a very small amount is in the blood. Because the amount in the blood is so small, minor changes can be life-threatening. CAUSES  Antibiotics.  Diarrhea or vomiting.  Using laxatives too much, which can cause diarrhea.  Chronic kidney disease.  Water pills (diuretics).  Eating disorders (bulimia).  Low magnesium level.  Sweating a lot. SIGNS AND SYMPTOMS  Weakness.  Constipation.  Fatigue.  Muscle cramps.  Mental confusion.  Skipped heartbeats or irregular heartbeat (palpitations).  Tingling or numbness. DIAGNOSIS  Your health care provider can diagnose hypokalemia with blood tests. In addition to checking your potassium level, your health care provider may also check other lab tests. TREATMENT Hypokalemia can be treated with potassium supplements taken by mouth or adjustments in your current medicines. If your potassium level is very low, you may need to get potassium through a vein (IV) and be monitored in the hospital. A diet high in potassium is also helpful. Foods high in potassium are:  Nuts, such as peanuts and pistachios.  Seeds, such as sunflower seeds and pumpkin seeds.  Peas, lentils, and lima beans.  Whole grain and bran  cereals and breads.  Fresh fruit and vegetables, such as apricots, avocado, bananas, cantaloupe, kiwi, oranges, tomatoes, asparagus, and potatoes.  Orange and tomato juices.  Red meats.  Fruit yogurt. HOME CARE INSTRUCTIONS  Take all medicines as prescribed by your health care provider.  Maintain a healthy diet by including nutritious food, such as fruits, vegetables, nuts, whole grains, and lean meats.  If you are taking a laxative, be sure to follow the directions on the label. SEEK MEDICAL CARE IF:  Your weakness gets worse.  You feel your heart pounding or racing.  You are vomiting or having diarrhea.  You are diabetic and having trouble keeping your blood glucose in the normal range. SEEK IMMEDIATE MEDICAL CARE IF:  You have chest pain, shortness of breath, or dizziness.  You are vomiting or having diarrhea for more than 2 days.  You faint. MAKE SURE YOU:   Understand these instructions.  Will watch your condition.  Will get help right away if you are not doing well or get worse. Document Released: 01/20/2005 Document Revised: 11/10/2012 Document Reviewed: 07/23/2012 [facility] Patient Information 2014 [place].

## 2013-06-01 NOTE — ED Provider Notes (Signed)
CSN: [id number]     Arrival date & time 06/01/13  1842 History   First MD Initiated Contact with Patient 06/01/13 1848     Chief Complaint  Patient presents with  . Shortness of Breath     (Consider location/radiation/quality/duration/timing/severity/associated sxs/prior Treatment) HPI  Kelly Joseph is a 54 y.o. female complaining of acute onset of shortness of breath approximately one hour prior to arrival. Patient states that she can't use her hands and they feel none. Has history of anxiety disorder, has taken Xanax for in the past but has not needed it recently. Patient denies any stressful life events of onset of anxiety attacks. She denies chest pain, cough, fever.  Past Medical History  Diagnosis Date  . Anxiety   . Depression   . Hyperlipidemia 03/14/2013   Past Surgical History  Procedure Laterality Date  . Mastopexy  1989/1999  . Liposuction  1999/2009  . Blepharoplasty  2006  . Resty    . Restylane  2005   Family History  Problem Relation Age of Onset  . Cancer Mother     breast ca  . Cancer Maternal Grandfather     ovarian ca  . Colon cancer Neg Hx   . Pancreatic cancer Neg Hx   . Stomach cancer Neg Hx    History  Substance Use Topics  . Smoking status: Former Smoker    Quit date: 09/23/1982  . Smokeless tobacco: Never Used  . Alcohol Use: Yes     Comment: socialy   OB History   Grav Para Term Preterm Abortions TAB SAB Ect Mult Living                 Review of Systems  10 systems reviewed and found to be negative, except as noted in the HPI.  Allergies  Latex and Sulfa antibiotics  Home Medications   Prior to Admission medications   Medication Sig Start Date End Date Taking? Authorizing Provider  ALPRAZolam Duanne Moron) 0.25 MG tablet TAKE 1 TABLET BY MOUTH DAILY AS NEEDED 03/18/13   Orlena Sheldon, PA-C  buPROPion (WELLBUTRIN XL) 150 MG 24 hr tablet Take 1 tablet (150 mg total) by mouth daily. 03/14/13   Lonie Peak Dixon, PA-C  buPROPion (WELLBUTRIN  XL) 300 MG 24 hr tablet Take 1 tablet (300 mg total) by mouth daily. 03/14/13   Lonie Peak Dixon, PA-C  cetirizine (ZYRTEC) 10 MG tablet Take 10 mg by mouth daily.    Historical Provider, MD  cyclobenzaprine (FLEXERIL) 10 MG tablet Take 1 tablet (10 mg total) by mouth 3 (three) times daily as needed for muscle spasms. 03/14/13   Mary B Dixon, PA-C  Flaxseed, Linseed, (FLAX SEEDS PO) Take by mouth.    Historical Provider, MD  fluticasone (FLONASE) 50 MCG/ACT nasal spray Place 2 sprays into both nostrils daily. 03/14/13   Orlena Sheldon, PA-C  Multiple Vitamin (MULTIVITAMIN) tablet Take 1 tablet by mouth daily.    Historical Provider, MD  omega-3 acid ethyl esters (LOVAZA) 1 G capsule Take 2 g by mouth 2 (two) times daily.    Historical Provider, MD  simvastatin (ZOCOR) 10 MG tablet Take 1 tablet (10 mg total) by mouth at bedtime. 03/14/13   Mary B Dixon, PA-C   BP 119/80  Pulse 84  Resp 18  SpO2 99% Physical Exam  Nursing note and vitals reviewed. Constitutional: She is oriented to person, place, and time. She appears well-developed and well-nourished. No distress.  HENT:  Head: Normocephalic and atraumatic.  Mouth/Throat: Oropharynx is clear and moist.  Eyes: Conjunctivae and EOM are normal. Pupils are equal, round, and reactive to light.  Neck: Normal range of motion.  Cardiovascular: Normal rate, regular rhythm and intact distal pulses.   Pulmonary/Chest: Effort normal and breath sounds normal. No stridor. No respiratory distress. She has no wheezes. She has no rales. She exhibits no tenderness.  Abdominal: Soft. Bowel sounds are normal. She exhibits no distension and no mass. There is no tenderness. There is no rebound and no guarding.  Musculoskeletal: Normal range of motion. She exhibits no edema.  Neurological: She is alert and oriented to person, place, and time.  Psychiatric:  Agitated, tachypneic    ED Course  Procedures (including critical care time) Labs Review Labs Reviewed  BASIC  METABOLIC PANEL - Abnormal; Notable for the following:    Potassium 3.2 (*)    All other components within normal limits  CBC WITH DIFFERENTIAL  TROPONIN I    Imaging Review Dg Chest 2 View  06/01/2013   CLINICAL DATA:  Shortness of breath.  EXAM: CHEST  2 VIEW  COMPARISON:  None.  FINDINGS: The cardiac silhouette, mediastinal and hilar contours are normal. The lungs are clear. The bony thorax is intact.  IMPRESSION: No acute cardiopulmonary findings.   Electronically Signed   By: Kalman Jewels M.D.   On: 06/01/2013 19:34     EKG Interpretation None      MDM   Final diagnoses:  Anxiety attack  Hypokalemia    Filed Vitals:   06/01/13 1946  BP: 119/80  Pulse: 84  Resp: 18  SpO2: 99%    Medications  LORazepam (ATIVAN) injection 1 mg (1 mg Intravenous Given 06/01/13 1914)  potassium chloride SA (K-DUR,KLOR-CON) CR tablet 40 mEq (40 mEq Oral Given 06/01/13 2049)    Kelly Joseph is a 53 y.o. female presenting with shortness of breath, appears to be panic attack. Cardiac workup initiated out of an abundance of caution. EKG with no acute abnormality other than sinus tachycardia, chest x-ray clear, troponin negative, blood work shows mild hypokalemia at 3.2. Patient was very well to 1 mg of Ativan with complete resolution of all symptoms.   Evaluation does not show pathology that would require ongoing emergent intervention or inpatient treatment. Pt is hemodynamically stable and mentating appropriately. Discussed findings and plan with patient/guardian, who agrees with care plan. All questions answered. Return precautions discussed and outpatient follow up given.   Discharge Medication List as of 06/01/2013  8:33 PM    START taking these medications   Details  potassium chloride SA (K-DUR,KLOR-CON) 20 MEQ tablet Take 1 tablet (20 mEq total) by mouth daily., Starting 06/01/2013, Until Discontinued, Print        Note: Portions of this report may have been transcribed  using voice recognition software. Every effort was made to ensure accuracy; however, inadvertent computerized transcription errors may be present     Monico Blitz, PA-C 06/02/13 1940

## 2013-06-01 NOTE — ED Notes (Addendum)
Pt states she has a hx of asthma a long time ago. Started feeling short of breath earlier today and it got progressively worse. Alert and oriented. Speaking in complete sentences. Hx of anxiety. States she was in [employer] shopping when she started having sob.

## 2013-06-02 NOTE — ED Provider Notes (Signed)
Medical screening examination/treatment/procedure(s) were performed by non-physician practitioner and as supervising physician I was immediately available for consultation/collaboration.  Leota Jacobsen, MD 06/02/13 [phone]

## 2013-08-15 ENCOUNTER — Other Ambulatory Visit: Payer: Self-pay | Admitting: Physician Assistant

## 2013-08-17 ENCOUNTER — Other Ambulatory Visit: Payer: Self-pay | Admitting: Physician Assistant

## 2013-09-12 ENCOUNTER — Other Ambulatory Visit: Payer: Self-pay | Admitting: Physician Assistant

## 2013-09-12 NOTE — Telephone Encounter (Signed)
Refill appropriate and filled per protocol. 

## 2013-10-09 ENCOUNTER — Other Ambulatory Visit: Payer: Self-pay | Admitting: Physician Assistant

## 2013-12-09 ENCOUNTER — Telehealth: Payer: Self-pay | Admitting: Family Medicine

## 2013-12-09 MED ORDER — BUPROPION HCL ER (XL) 300 MG PO TB24: 300.0000 mg | ORAL_TABLET | Freq: Every day | ORAL | Status: DC

## 2013-12-09 NOTE — Telephone Encounter (Signed)
Medication refilled per protocol. 

## 2014-01-06 ENCOUNTER — Other Ambulatory Visit: Payer: Self-pay | Admitting: Physician Assistant

## 2014-01-06 NOTE — Telephone Encounter (Signed)
Last I can tell refilled 03/18/13  #30 +1.  Has CPE in 02/15  Told F/U 1 year.  OK refill?

## 2014-01-09 NOTE — Telephone Encounter (Signed)
Approved #30+ one additional refill

## 2014-01-10 NOTE — Telephone Encounter (Signed)
rx called in

## 2014-01-23 ENCOUNTER — Other Ambulatory Visit: Payer: Self-pay | Admitting: Physician Assistant

## 2014-01-23 NOTE — Telephone Encounter (Signed)
Medication refilled per protocol. 

## 2014-03-14 ENCOUNTER — Other Ambulatory Visit: Payer: Managed Care, Other (non HMO)

## 2014-03-14 DIAGNOSIS — F329 Major depressive disorder, single episode, unspecified: Secondary | ICD-10-CM

## 2014-03-14 DIAGNOSIS — Z0000 Encounter for general adult medical examination without abnormal findings: Secondary | ICD-10-CM

## 2014-03-14 DIAGNOSIS — F419 Anxiety disorder, unspecified: Secondary | ICD-10-CM

## 2014-03-14 DIAGNOSIS — F32A Depression, unspecified: Secondary | ICD-10-CM

## 2014-03-14 DIAGNOSIS — Z79899 Other long term (current) drug therapy: Secondary | ICD-10-CM

## 2014-03-14 DIAGNOSIS — E785 Hyperlipidemia, unspecified: Secondary | ICD-10-CM

## 2014-03-14 LAB — COMPLETE METABOLIC PANEL WITHOUT GFR
ALT: 17 U/L (ref 0–35)
AST: 16 U/L (ref 0–37)
Albumin: 4 g/dL (ref 3.5–5.2)
Alkaline Phosphatase: 44 U/L (ref 39–117)
BUN: 8 mg/dL (ref 6–23)
CO2: 27 meq/L (ref 19–32)
Calcium: 9.3 mg/dL (ref 8.4–10.5)
Chloride: 103 meq/L (ref 96–112)
Creat: 0.62 mg/dL (ref 0.50–1.10)
GFR, Est African American: >89 mL/min
GFR, Est Non African American: >89 mL/min
Glucose, Bld: 89 mg/dL (ref 70–99)
Potassium: 4.7 meq/L (ref 3.5–5.3)
Sodium: 139 meq/L (ref 135–145)
Total Bilirubin: 0.4 mg/dL (ref 0.2–1.2)
Total Protein: 6.4 g/dL (ref 6.0–8.3)

## 2014-03-14 LAB — CBC WITH DIFFERENTIAL/PLATELET
Basophils Absolute: 0.1 K/uL (ref 0.0–0.1)
Basophils Relative: 1 % (ref 0–1)
Eosinophils Absolute: 0.2 K/uL (ref 0.0–0.7)
Eosinophils Relative: 3 % (ref 0–5)
HCT: 41.7 % (ref 36.0–46.0)
Hemoglobin: 13.7 g/dL (ref 12.0–15.0)
Lymphocytes Relative: 43 % (ref 12–46)
Lymphs Abs: 2.2 K/uL (ref 0.7–4.0)
MCH: 29.7 pg (ref 26.0–34.0)
MCHC: 32.9 g/dL (ref 30.0–36.0)
MCV: 90.5 fL (ref 78.0–100.0)
MPV: 9.7 fL (ref 8.6–12.4)
Monocytes Absolute: 0.5 K/uL (ref 0.1–1.0)
Monocytes Relative: 9 % (ref 3–12)
Neutro Abs: 2.2 K/uL (ref 1.7–7.7)
Neutrophils Relative %: 44 % (ref 43–77)
Platelets: 334 K/uL (ref 150–400)
RBC: 4.61 MIL/uL (ref 3.87–5.11)
RDW: 13.5 % (ref 11.5–15.5)
WBC: 5 K/uL (ref 4.0–10.5)

## 2014-03-14 LAB — LIPID PANEL
Cholesterol: 163 mg/dL (ref 0–200)
HDL: 62 mg/dL
LDL Cholesterol: 88 mg/dL (ref 0–99)
Total CHOL/HDL Ratio: 2.6 ratio
Triglycerides: 63 mg/dL
VLDL: 13 mg/dL (ref 0–40)

## 2014-03-14 LAB — TSH: TSH: 1.679 u[IU]/mL (ref 0.350–4.500)

## 2014-03-15 ENCOUNTER — Encounter: Payer: Self-pay | Admitting: Family Medicine

## 2014-03-21 ENCOUNTER — Encounter: Payer: Managed Care, Other (non HMO) | Admitting: Physician Assistant

## 2014-03-22 ENCOUNTER — Encounter: Payer: Managed Care, Other (non HMO) | Admitting: Physician Assistant

## 2014-03-23 ENCOUNTER — Ambulatory Visit (INDEPENDENT_AMBULATORY_CARE_PROVIDER_SITE_OTHER): Payer: Managed Care, Other (non HMO) | Admitting: Physician Assistant

## 2014-03-23 ENCOUNTER — Encounter: Payer: Self-pay | Admitting: Physician Assistant

## 2014-03-23 VITALS — BP 100/68 | HR 64 | Temp 98.0°F | Resp 18 | Ht 62.0 in | Wt 120.0 lb

## 2014-03-23 DIAGNOSIS — F419 Anxiety disorder, unspecified: Secondary | ICD-10-CM

## 2014-03-23 DIAGNOSIS — Z0000 Encounter for general adult medical examination without abnormal findings: Secondary | ICD-10-CM

## 2014-03-23 DIAGNOSIS — E785 Hyperlipidemia, unspecified: Secondary | ICD-10-CM

## 2014-03-23 DIAGNOSIS — F32A Depression, unspecified: Secondary | ICD-10-CM

## 2014-03-23 DIAGNOSIS — F329 Major depressive disorder, single episode, unspecified: Secondary | ICD-10-CM

## 2014-03-23 MED ORDER — SERTRALINE HCL 50 MG PO TABS: 50.0000 mg | ORAL_TABLET | Freq: Every day | ORAL | Status: DC

## 2014-03-23 NOTE — Progress Notes (Signed)
Patient ID: Kelly Joseph MRN: [id number], DOB: April 09, 1960, 54 y.o. Date of Encounter: 03/23/2014,   Chief Complaint: Physical (CPE)  HPI: 54 y.o. y/o female  here for CPE.   When She was here for her physical in 2014, she requested that we increase her Wellbutrin dose as she was often feeling overwhelmed.  At f/u OV she said that this medication has worked very well for her. Feeling much better with this increased dose. Having no adverse effects. Rarely needs the Xanax but does like to have it on hand for when needed.  Today--03/2014--- she states that she has been having some depression recently. Says that last Friday was a bad day she just cried and cried. Visit there was nothing going on that day for her to be upset about. Says that for the past 6 months she has been feeling anxious. Says that she will sometimes have a "sinking feeling "and feelings of feeling depressed "  AT PAST VISITS, WE HAVE ALSO PRESCRIBED:  --- Flexeril to still have available as needed for some back pain that is secondary to muscle strain.  ---- Zyrtec and Flonase for allergies.  She continues to exercise 5 or 6 days per week at home. She does different types of videos for aerobics and does some weight lifting as well. As well she is very cautious about her diet and eats a very healthy diet. Has no angina symptoms with her exercise.   Review of Systems: Consitutional: No fever, chills, fatigue, night sweats, lymphadenopathy. No significant/unexplained weight changes. Eyes: No visual changes, eye redness, or discharge. ENT/Mouth: No ear pain, sore throat, nasal drainage, or sinus pain. Cardiovascular: No chest pressure,heaviness, tightness or squeezing, even with exertion. No increased shortness of breath or dyspnea on exertion.No palpitations, edema, orthopnea, PND. Respiratory: No cough, hemoptysis, SOB, or wheezing. Gastrointestinal: No anorexia, dysphagia, reflux, pain, nausea, vomiting,  hematemesis, diarrhea, constipation, BRBPR, or melena. Breast: No mass, nodules, bulging, or retraction. No skin changes or inflammation. No nipple discharge. No lymphadenopathy. Genitourinary: No dysuria, hematuria, incontinence, vaginal discharge, pruritis, burning, abnormal bleeding, or pain. Musculoskeletal: No decreased ROM, No joint pain or swelling. No significant pain in neck, back, or extremities. Skin: No rash, pruritis, or concerning lesions. Neurological: No headache, dizziness, syncope, seizures, tremors, memory loss, coordination problems, or paresthesias. Psychological: No anxiety, depression, hallucinations, SI/HI. Endocrine: No polydipsia, polyphagia, polyuria, or known diabetes.No increased fatigue. No palpitations/rapid heart rate. No significant/unexplained weight change. All other systems were reviewed and are otherwise negative.  Past Medical History  Diagnosis Date  . Anxiety   . Depression   . Hyperlipidemia 03/14/2013     Past Surgical History  Procedure Laterality Date  . Mastopexy  1989/1999  . Liposuction  1999/2009  . Blepharoplasty  2006  . Resty    . Restylane  2005    Home Meds:  Current Outpatient Prescriptions on File Prior to Visit  Medication Sig Dispense Refill  . ALPRAZolam (XANAX) 0.25 MG tablet TAKE 1 TABLET EVERY DAY AS NEEDED 30 tablet 1  . buPROPion (WELLBUTRIN XL) 300 MG 24 hr tablet Take 1 tablet (300 mg total) by mouth daily. 30 tablet 3  . cetirizine (ZYRTEC) 10 MG tablet Take 10 mg by mouth daily.    . Flaxseed, Linseed, (FLAX SEEDS PO) Take 1 tablet by mouth daily.     . fluticasone (FLONASE) 50 MCG/ACT nasal spray Place 2 sprays into both nostrils daily. 16 g 11  . omega-3 acid ethyl esters (LOVAZA) 1 G  capsule Take 1 g by mouth daily.     . potassium chloride SA (K-DUR,KLOR-CON) 20 MEQ tablet Take 1 tablet (20 mEq total) by mouth daily. 3 tablet 0  . simvastatin (ZOCOR) 10 MG tablet TAKE 1 TABLET (10 MG TOTAL) BY MOUTH AT BEDTIME.  30 tablet 5   No current facility-administered medications on file prior to visit.    Allergies:  Allergies  Allergen Reactions  . Latex Hives  . Sulfa Antibiotics Hives    History   Social History  . Marital Status: Married    Spouse Name: N/A  . Number of Children: N/A  . Years of Education: N/A   Occupational History  . Not on file.   Social History Main Topics  . Smoking status: Former Smoker    Quit date: 09/23/1982  . Smokeless tobacco: Never Used  . Alcohol Use: Yes     Comment: socialy  . Drug Use: No  . Sexual Activity: Not on file   Other Topics Concern  . 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 Cancer Mother     breast ca  . Cancer Maternal Grandfather     ovarian ca  . Colon cancer Neg Hx   . Pancreatic cancer Neg Hx   . Stomach cancer Neg Hx     Physical Exam: Blood pressure 100/68, pulse 64, temperature 98 F (36.7 C), temperature source Oral, resp. rate 18, height '5\' 2"'  (1.575 m), weight 120 lb (54.432 kg)., Body mass index is 21.94 kg/(m^2). General: Well developed, well nourished,WF. Appears in no acute distress. HEENT: Normocephalic, atraumatic. Conjunctiva pink, sclera non-icteric. Pupils 2 mm constricting to 1 mm, round, regular, and equally reactive to light and accomodation. EOMI. Internal auditory canal clear. TMs with good cone of light and without pathology. Nasal mucosa pink. Nares are without discharge. No sinus tenderness. Oral mucosa pink.  Pharynx without exudate.   Neck: Supple. Trachea midline. No thyromegaly. Full ROM. No lymphadenopathy.No Carotid Bruits. Lungs: Clear to auscultation bilaterally without wheezes, rales, or rhonchi. Breathing is of normal effort and unlabored. Cardiovascular: RRR with S1 S2. No murmurs, rubs, or gallops. Distal pulses 2+ symmetrically. No carotid or abdominal bruits. Breast:  Symmetrical. No masses. Nipples without discharge. Abdomen: Soft, non-tender, non-distended with normoactive bowel sounds. No hepatosplenomegaly or masses. No rebound/guarding. No CVA tenderness. No hernias.  Genitourinary:  External genitalia without lesions. Vaginal mucosa pink.No discharge present. Cervix pink and without discharge. No cervical tenderness.Normal uterus size. No adnexal mass or tenderness.  Musculoskeletal: Full range of motion and 5/5 strength throughout. Without swelling, atrophy, tenderness, crepitus, or warmth. Extremities without clubbing, cyanosis, or edema.  Skin: Warm and moist without erythema, ecchymosis, wounds, or rash. Neuro: A+Ox3. CN II-XII grossly intact. Moves all extremities spontaneously. Full sensation throughout. Normal gait.  Psych:  Responds to questions appropriately with a normal affect.   Assessment/Plan:  54 y.o. y/o female here for CPE 1. Encounter for preventive health examination   A. Screening Labs: Results for orders placed or performed in visit on 03/14/14  COMPLETE METABOLIC PANEL WITH GFR  Result Value Ref Range   Sodium 139 135 - 145 mEq/L   Potassium 4.7 3.5 - 5.3 mEq/L   Chloride 103 96 - 112 mEq/L   CO2 27 19 - 32 mEq/L   Glucose, Bld 89 70 - 99  mg/dL   BUN 8 6 - 23 mg/dL   Creat 0.62 0.50 - 1.10 mg/dL   Total Bilirubin 0.4 0.2 - 1.2 mg/dL   Alkaline Phosphatase 44 39 - 117 U/L   AST 16 0 - 37 U/L   ALT 17 0 - 35 U/L   Total Protein 6.4 6.0 - 8.3 g/dL   Albumin 4.0 3.5 - 5.2 g/dL   Calcium 9.3 8.4 - 10.5 mg/dL   GFR, Est African American >89 mL/min   GFR, Est Non African American >89 mL/min  TSH  Result Value Ref Range   TSH 1.679 0.350 - 4.500 uIU/mL  Lipid panel  Result Value Ref Range   Cholesterol 163 0 - 200 mg/dL   Triglycerides 63 <150 mg/dL   HDL 62 >39 mg/dL   Total CHOL/HDL Ratio 2.6 Ratio   VLDL 13 0 - 40 mg/dL   LDL Cholesterol 88 0 - 99 mg/dL  CBC with Differential/Platelet  Result Value Ref Range    WBC 5.0 4.0 - 10.5 K/uL   RBC 4.61 3.87 - 5.11 MIL/uL   Hemoglobin 13.7 12.0 - 15.0 g/dL   HCT 41.7 36.0 - 46.0 %   MCV 90.5 78.0 - 100.0 fL   MCH 29.7 26.0 - 34.0 pg   MCHC 32.9 30.0 - 36.0 g/dL   RDW 13.5 11.5 - 15.5 %   Platelets 334 150 - 400 K/uL   MPV 9.7 8.6 - 12.4 fL   Neutrophils Relative % 44 43 - 77 %   Neutro Abs 2.2 1.7 - 7.7 K/uL   Lymphocytes Relative 43 12 - 46 %   Lymphs Abs 2.2 0.7 - 4.0 K/uL   Monocytes Relative 9 3 - 12 %   Monocytes Absolute 0.5 0.1 - 1.0 K/uL   Eosinophils Relative 3 0 - 5 %   Eosinophils Absolute 0.2 0.0 - 0.7 K/uL   Basophils Relative 1 0 - 1 %   Basophils Absolute 0.1 0.0 - 0.1 K/uL   Smear Review Criteria for review not met      B. Pap: Last Pap smear was performed 05/21/2011. Both cytology and HPV co testing were negative. Therefore can wait 5 years to repeat. Due 05/2016.  C. Screening Mammogram: She states that she always has her mammogram at the same time as her physical. She will go home today and call and scheduled for followup mammogram.   D. Colorectal Cancer Screening: She is now age 29.  At her CPE 03/2013 she reported  that she had already gone for screening colonoscopy recently.  Michela Pitcher it was completely normal and can wait 10 years to repeat.  F. Immunizations:  Tetanus: States that she had this performed on 10/14/2009 Zostavax: We'll discuss at age 40 Pneumonia Vaccine: We'll discuss at age 89 Influenza vaccine--- she says that she never gets the influenza vaccine and defers again today.    2. Anxiety 3. Depression  She says that she has never been on an SSRI. She says that in the past Dr. Harriett Rush had prescribed Wellbutrin in and had all raise worked very well for her. That's why she has simply requested increased dose of that medication in the past. At this point I will add Zoloft 50 mg. We'll have her schedule follow-up office visit in 6 weeks. Discussed expectations of medications. Told her to call me if it is  causing any adverse effects. Otherwise, even if she does not see any benefit from the medication, just continue taking it daily until  her follow-up appointment in 6 weeks.  4. Back pain She uses Flexeril rarely but wants to have this on hand in case she needs it  5. Allergic rhinitis Cont Zyrtec and Flonase PRN  6. Hyperlipidemia At her CPE 03/2013---Discussed her lipid panel Discussed that she has no cardiac risk factors. However she said it would be impossible for her to improve her diet and exercise any further than what she is doing. LDL was 184. She was agreeable to start low-dose statin. started simvastatin (ZOCOR) 10 MG tablet at that time.  FLP now excellent.  LFTs normal.     F/U Office visit 6 weeks or sooner if needed.  [address] [place], [place], [facility] 03/23/2014 3:16 PM

## 2014-04-08 ENCOUNTER — Other Ambulatory Visit: Payer: Self-pay | Admitting: Physician Assistant

## 2014-04-10 NOTE — Telephone Encounter (Signed)
Medication refilled per protocol. 

## 2014-04-11 LAB — HM MAMMOGRAPHY: HM MAMMO: NORMAL

## 2014-05-03 ENCOUNTER — Encounter: Payer: Self-pay | Admitting: Family Medicine

## 2014-05-08 ENCOUNTER — Ambulatory Visit: Payer: Managed Care, Other (non HMO) | Admitting: Physician Assistant

## 2014-05-13 ENCOUNTER — Other Ambulatory Visit: Payer: Self-pay | Admitting: Physician Assistant

## 2014-05-15 NOTE — Telephone Encounter (Signed)
Medication refilled per protocol. 

## 2014-05-18 ENCOUNTER — Ambulatory Visit (INDEPENDENT_AMBULATORY_CARE_PROVIDER_SITE_OTHER): Payer: Managed Care, Other (non HMO) | Admitting: Physician Assistant

## 2014-05-18 ENCOUNTER — Encounter: Payer: Self-pay | Admitting: Physician Assistant

## 2014-05-18 VITALS — BP 116/70 | HR 76 | Temp 98.3°F | Resp 18 | Wt 118.0 lb

## 2014-05-18 DIAGNOSIS — F32A Depression, unspecified: Secondary | ICD-10-CM

## 2014-05-18 DIAGNOSIS — F329 Major depressive disorder, single episode, unspecified: Secondary | ICD-10-CM

## 2014-05-18 DIAGNOSIS — F419 Anxiety disorder, unspecified: Secondary | ICD-10-CM | POA: Diagnosis not present

## 2014-05-18 MED ORDER — SERTRALINE HCL 50 MG PO TABS: 50.0000 mg | ORAL_TABLET | Freq: Every day | ORAL | Status: DC

## 2014-05-18 NOTE — Progress Notes (Signed)
Patient ID: Kelly Joseph MRN: [id number], DOB: 12-Feb-1960, 54 y.o. Date of Encounter: 05/18/2014,   Chief Complaint: Physical (CPE)  HPI: 54 y.o. y/o female  here for CPE.   When She was here for her physical in 2014, she requested that we increase her Wellbutrin dose as she was often feeling overwhelmed.  At f/u OV she said that this medication has worked very well for her. Feeling much better with this increased dose. Having no adverse effects. Rarely needs the Xanax but does like to have it on hand for when needed.  At OV--03/2014--- she states that she has been having some depression recently. Says that last Friday was a bad day she just cried and cried. Says there was nothing going on that day for her to be upset about. Says that for the past 6 months she has been feeling anxious. Says that she will sometimes have a "sinking feeling "and feelings of feeling depressed "  At that [place], She said that she had never been on an SSRI. She said that in the past Dr. Harriett Rush had prescribed Wellbutrin and that had always worked very well for her. That's why she has simply requested increased dose of that medication in the past. At that OV,  added Zoloft 50 mg. Told her schedule follow-up office visit in 6 weeks. Discussed expectations of medications. Told her to call me if it is causing any adverse effects. Otherwise, even if she does not see any benefit from the medication, just continue taking it daily until her follow-up appointment in 6 weeks.  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 Says she feels great. Says that she is not feeling anxious anymore. Has had no adverse effects with the medication.  AT PAST VISITS, WE HAVE ALSO PRESCRIBED:  --- Flexeril to still have available as needed for some back pain that is secondary to muscle strain.  ---- Zyrtec and Flonase for allergies.  She continues to exercise 5 or 6 days per week at home. She does different  types of videos for aerobics and does some weight lifting as well. As well she is very cautious about her diet and eats a very healthy diet. Has no angina symptoms with her exercise.   Review of Systems: Consitutional: No fever, chills, fatigue, night sweats, lymphadenopathy. No significant/unexplained weight changes. Eyes: No visual changes, eye redness, or discharge. ENT/Mouth: No ear pain, sore throat, nasal drainage, or sinus pain. Cardiovascular: No chest pressure,heaviness, tightness or squeezing, even with exertion. No increased shortness of breath or dyspnea on exertion.No palpitations, edema, orthopnea, PND. Respiratory: No cough, hemoptysis, SOB, or wheezing. Gastrointestinal: No anorexia, dysphagia, reflux, pain, nausea, vomiting, hematemesis, diarrhea, constipation, BRBPR, or melena. Breast: No mass, nodules, bulging, or retraction. No skin changes or inflammation. No nipple discharge. No lymphadenopathy. Genitourinary: No dysuria, hematuria, incontinence, vaginal discharge, pruritis, burning, abnormal bleeding, or pain. Musculoskeletal: No decreased ROM, No joint pain or swelling. No significant pain in neck, back, or extremities. Skin: No rash, pruritis, or concerning lesions. Neurological: No headache, dizziness, syncope, seizures, tremors, memory loss, coordination problems, or paresthesias. Psychological: No anxiety, depression, hallucinations, SI/HI. Endocrine: No polydipsia, polyphagia, polyuria, or known diabetes.No increased fatigue. No palpitations/rapid heart rate. No significant/unexplained weight change. All other systems were reviewed and are otherwise negative.  Past Medical History  Diagnosis Date  . Anxiety   . Depression   . Hyperlipidemia 03/14/2013     Past Surgical History  Procedure Laterality Date  .  Mastopexy  1989/1999  . Liposuction  1999/2009  . Blepharoplasty  2006  . Resty    . Restylane  2005    Home Meds:  Current Outpatient Prescriptions  on File Prior to Visit  Medication Sig Dispense Refill  . ALPRAZolam (XANAX) 0.25 MG tablet TAKE 1 TABLET EVERY DAY AS NEEDED 30 tablet 1  . buPROPion (WELLBUTRIN XL) 150 MG 24 hr tablet Take 1 tablet (150 mg total) by mouth daily. 30 tablet 0  . buPROPion (WELLBUTRIN XL) 300 MG 24 hr tablet TAKE 1 TABLET EVERY DAY 30 tablet 0  . cetirizine (ZYRTEC) 10 MG tablet Take 10 mg by mouth daily.    . Flaxseed, Linseed, (FLAX SEEDS PO) Take 1 tablet by mouth daily.     . fluticasone (FLONASE) 50 MCG/ACT nasal spray Place 2 sprays into both nostrils daily. 16 g 11  . omega-3 acid ethyl esters (LOVAZA) 1 G capsule Take 1 g by mouth daily.     . potassium chloride SA (K-DUR,KLOR-CON) 20 MEQ tablet Take 1 tablet (20 mEq total) by mouth daily. 3 tablet 0  . simvastatin (ZOCOR) 10 MG tablet TAKE 1 TABLET (10 MG TOTAL) BY MOUTH AT BEDTIME. 30 tablet 5   No current facility-administered medications on file prior to visit.    Allergies:  Allergies  Allergen Reactions  . Latex Hives  . Sulfa Antibiotics Hives    History   Social History  . Marital Status: Married    Spouse Name: N/A  . Number of Children: N/A  . Years of Education: N/A   Occupational History  . Not on file.   Social History Main Topics  . Smoking status: Former Smoker    Quit date: 09/23/1982  . Smokeless tobacco: Never Used  . Alcohol Use: Yes     Comment: socialy  . Drug Use: No  . Sexual Activity: Not on file   Other Topics Concern  . 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 Cancer Mother     breast ca  . Cancer Maternal Grandfather     ovarian ca  . Colon cancer Neg Hx   . Pancreatic cancer Neg Hx   . Stomach cancer Neg Hx     Physical Exam: Blood pressure 116/70, pulse 76, temperature 98.3 F (36.8 C), temperature source Oral, resp. rate 18, weight 118 lb (53.524 kg)., Body mass  index is 21.58 kg/(m^2). General: Well developed, well nourished,WF. Appears in no acute distress. HEENT: Normocephalic, atraumatic. Conjunctiva pink, sclera non-icteric. Pupils 2 mm constricting to 1 mm, round, regular, and equally reactive to light and accomodation. EOMI. Internal auditory canal clear. TMs with good cone of light and without pathology. Nasal mucosa pink. Nares are without discharge. No sinus tenderness. Oral mucosa pink.  Pharynx without exudate.   Neck: Supple. Trachea midline. No thyromegaly. Full ROM. No lymphadenopathy.No Carotid Bruits. Lungs: Clear to auscultation bilaterally without wheezes, rales, or rhonchi. Breathing is of normal effort and unlabored. Cardiovascular: RRR with S1 S2. No murmurs, rubs, or gallops. Distal pulses 2+ symmetrically. No carotid or abdominal bruits. Breast: Symmetrical. No masses. Nipples without discharge. Abdomen: Soft, non-tender, non-distended with normoactive bowel sounds. No hepatosplenomegaly or masses. No rebound/guarding. No CVA tenderness. No hernias.  Genitourinary:  External genitalia without lesions. Vaginal mucosa pink.No discharge present. Cervix pink and without discharge. No  cervical tenderness.Normal uterus size. No adnexal mass or tenderness.  Musculoskeletal: Full range of motion and 5/5 strength throughout. Without swelling, atrophy, tenderness, crepitus, or warmth. Extremities without clubbing, cyanosis, or edema.  Skin: Warm and moist without erythema, ecchymosis, wounds, or rash. Neuro: A+Ox3. CN II-XII grossly intact. Moves all extremities spontaneously. Full sensation throughout. Normal gait.  Psych:  Responds to questions appropriately with a normal affect.   Assessment/Plan:  54 y.o. y/o female here for  1. Anxiety 2. Depression  These symptoms are now well controlled on current medications! Continue current dose of Wellbutrin and current dose of Zoloft. Can wait 6-12 months for follow-up visit if remains stable.  Follow-up sooner if needed.   THE FOLLOWING IS COPIED FROM HER CPE 03/23/2014:  1. Encounter for preventive health examination   A. Screening Labs: Results for orders placed or performed in visit on 05/03/14  [facility] MAMMOGRAPHY  Result Value Ref Range   [facility] Mammogram normal  [facility]      B. Pap: Last Pap smear was performed 05/21/2011. Both cytology and HPV co testing were negative. Therefore can wait 5 years to repeat. Due 05/2016.  C. Screening Mammogram: She states that she always has her mammogram at the same time as her physical. She will go home today and call and scheduled for followup mammogram.   D. Colorectal Cancer Screening: She is now age 43.  At her CPE 03/2013 she reported  that she had already gone for screening colonoscopy recently.  Michela Pitcher it was completely normal and can wait 10 years to repeat.  F. Immunizations:  Tetanus: States that she had this performed on 10/14/2009 Zostavax: We'll discuss at age 41 Pneumonia Vaccine: We'll discuss at age 74 Influenza vaccine--- she says that she never gets the influenza vaccine and defers again today.    2. Anxiety 3. Depression ---SEE # 1   ABOVE---  4. Back pain She uses Flexeril rarely but wants to have this on hand in case she needs it  5. Allergic rhinitis Cont Zyrtec and Flonase PRN  6. Hyperlipidemia At her CPE 03/2013---Discussed her lipid panel Discussed that she has no cardiac risk factors. However she said it would be impossible for her to improve her diet and exercise any further than what she is doing. LDL was 184. She was agreeable to start low-dose statin. started simvastatin (ZOCOR) 10 MG tablet at that time.  FLP now excellent.  LFTs normal.     F/U Office visit 6 - 12 months, or sooner if needed.  Marin Olp [place], [place], BSFM 05/18/2014 4:00 PM

## 2014-06-11 ENCOUNTER — Other Ambulatory Visit: Payer: Self-pay | Admitting: Physician Assistant

## 2014-06-12 NOTE — Telephone Encounter (Signed)
Refill appropriate and filled per protocol. 

## 2014-07-26 IMAGING — CR DG LUMBAR SPINE COMPLETE 4+V
5 series · 5 of 5 positions shown · non-contrast
Comparison: None.

CLINICAL DATA: Pain with flexion and extension.

LUMBAR SPINE - COMPLETE 4+ VIEW

[t l-spine a.p.]
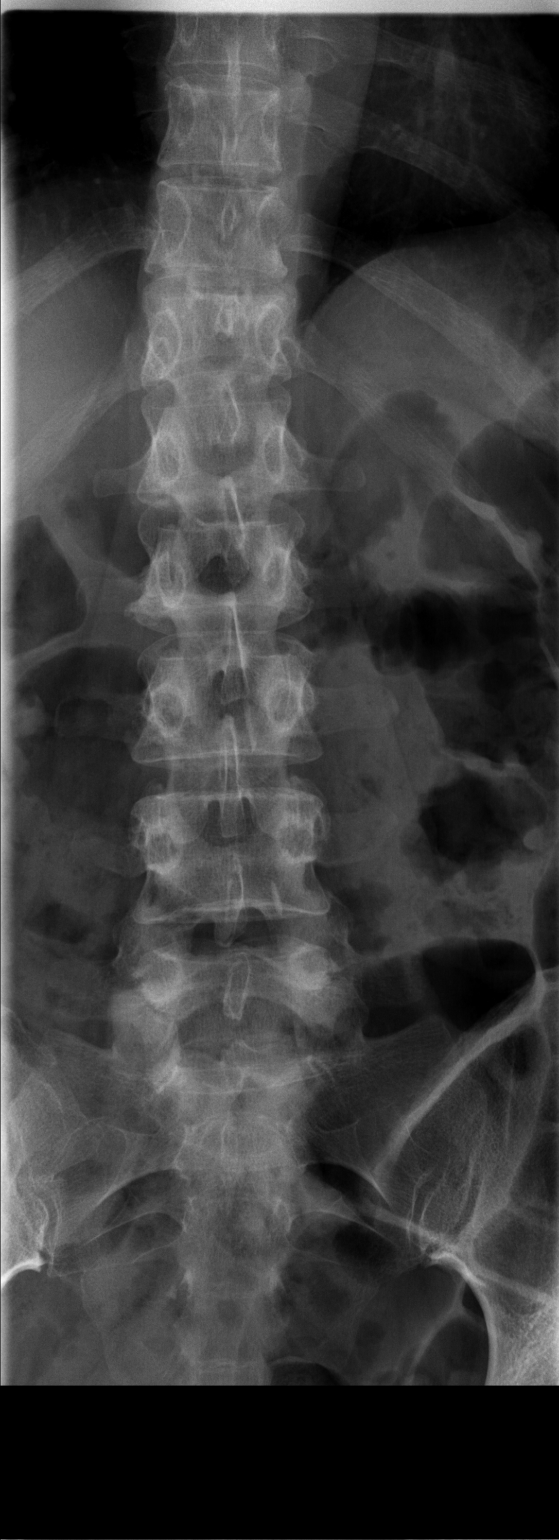

[t l-spine oblique exposure (1 of 2)]
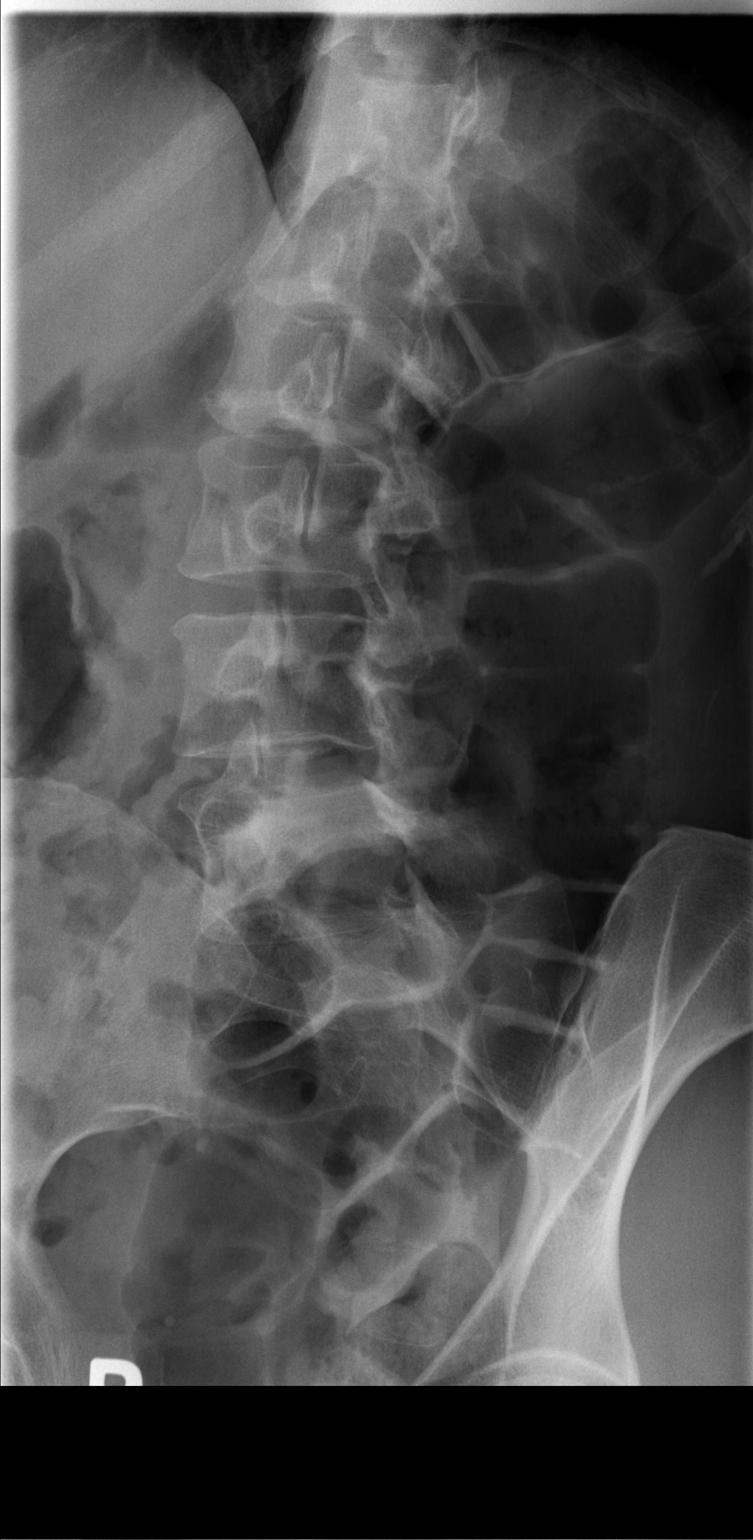

[t l-spine oblique exposure (2 of 2)]
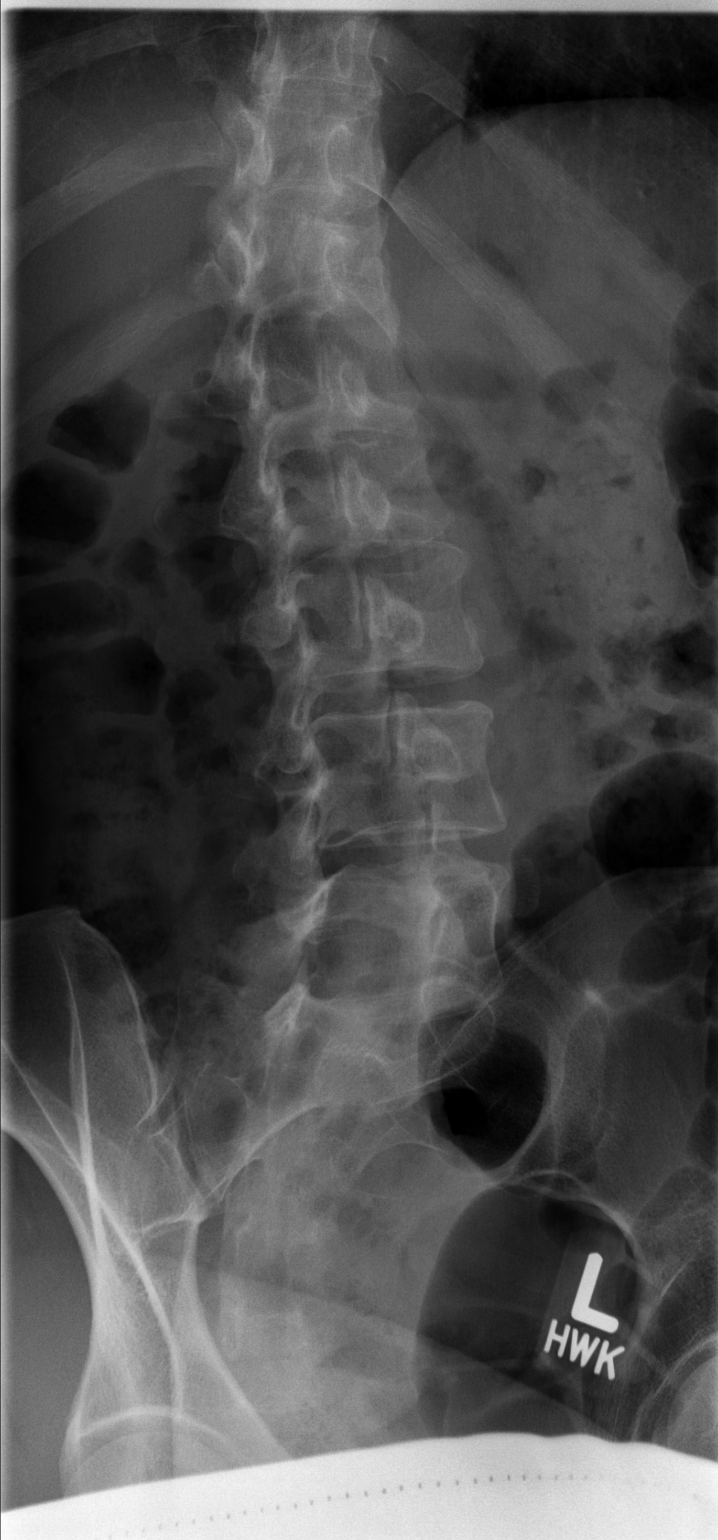

[t l-spine lat]
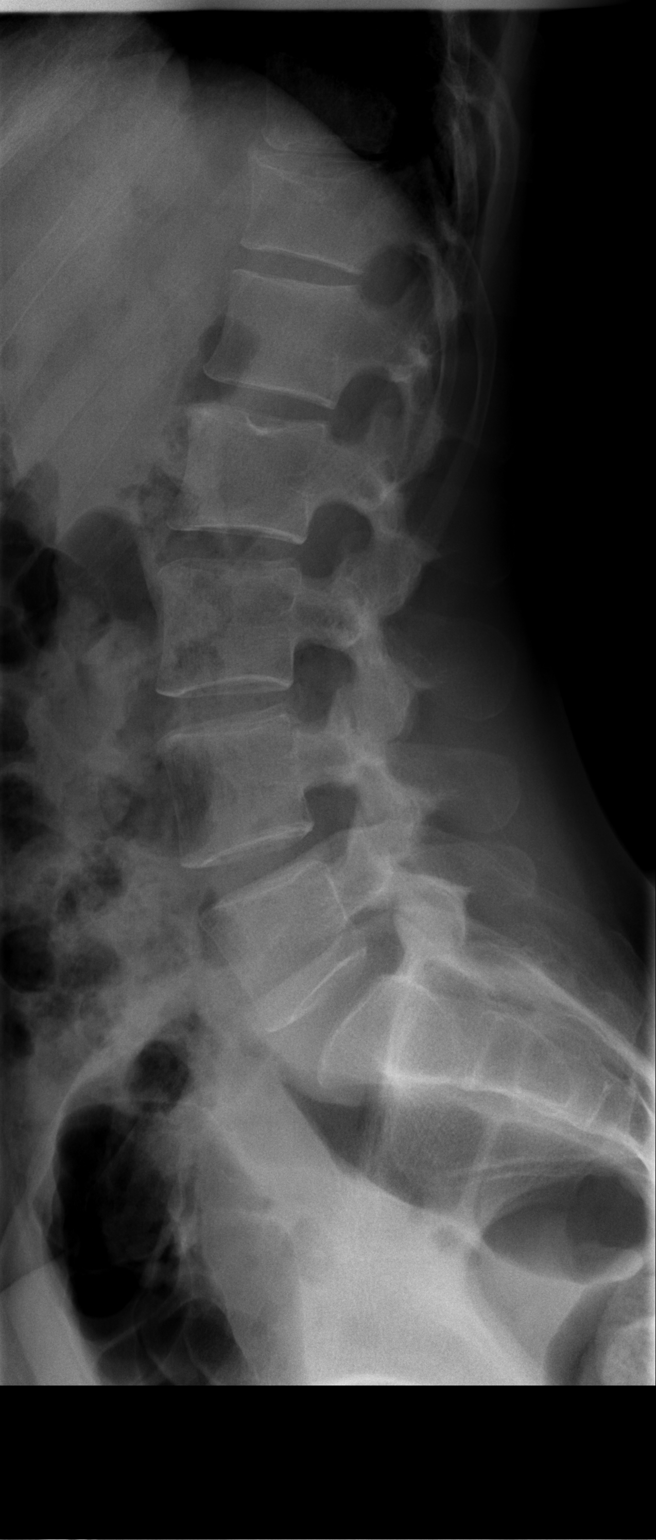

[t l-spine l5-s1 spot]
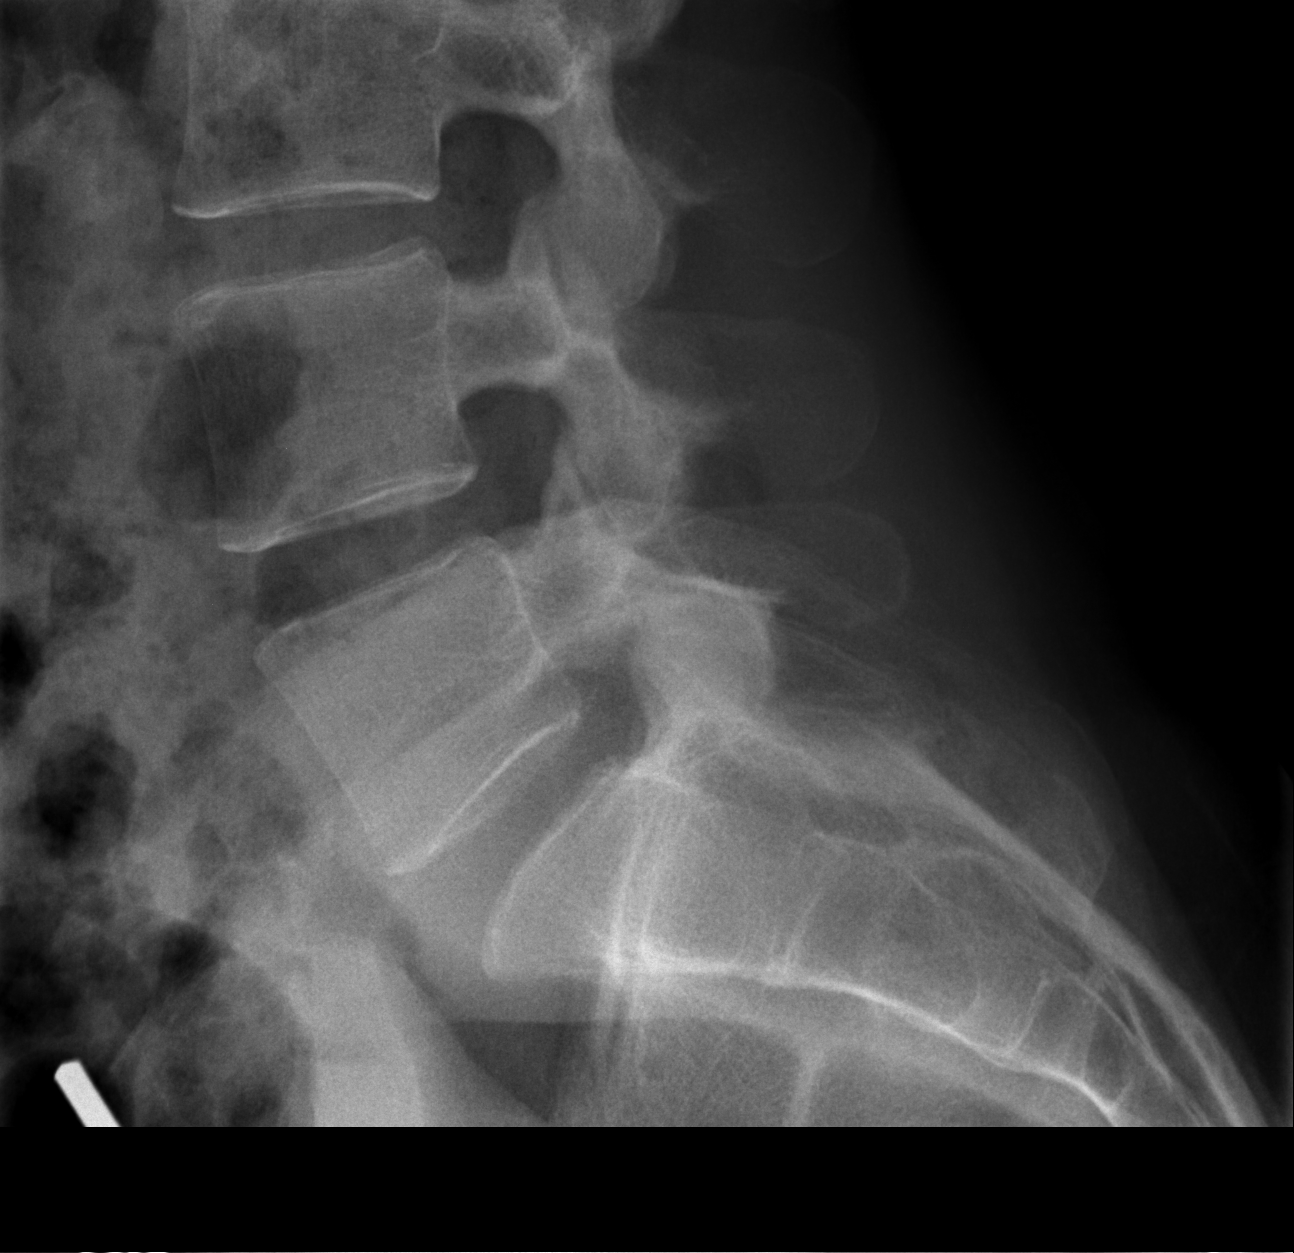

[5 of 5 positions shown; findings below may reference images not displayed]

FINDINGS: There is no evidence of lumbar spine fracture.  Alignment
is normal.  Intervertebral disc spaces are maintained.
IMPRESSION: Negative.

## 2014-08-09 ENCOUNTER — Other Ambulatory Visit: Payer: Self-pay | Admitting: Physician Assistant

## 2014-08-09 NOTE — Telephone Encounter (Signed)
Medication refilled per protocol. 

## 2014-09-21 ENCOUNTER — Other Ambulatory Visit: Payer: Self-pay | Admitting: Physician Assistant

## 2014-09-21 NOTE — Telephone Encounter (Signed)
Ok to refill??  Last office visit 05/18/2014.  Last refill 01/10/2014, #1 refill.

## 2014-09-21 NOTE — Telephone Encounter (Signed)
Medication called to pharmacy. 

## 2014-09-21 NOTE — Telephone Encounter (Signed)
Approved. #[id number]. 

## 2014-10-07 ENCOUNTER — Other Ambulatory Visit: Payer: Self-pay | Admitting: Physician Assistant

## 2014-10-10 NOTE — Telephone Encounter (Signed)
Medication refilled per protocol. 

## 2015-02-14 ENCOUNTER — Other Ambulatory Visit: Payer: Self-pay | Admitting: Physician Assistant

## 2015-02-14 NOTE — Telephone Encounter (Signed)
Medication refilled per protocol. 

## 2015-04-04 IMAGING — CR DG CHEST 2V
2 series · 2 of 2 positions shown · non-contrast
Comparison: None.

CLINICAL DATA: Shortness of breath.

EXAM:
CHEST  2 VIEW

[w chest lat]
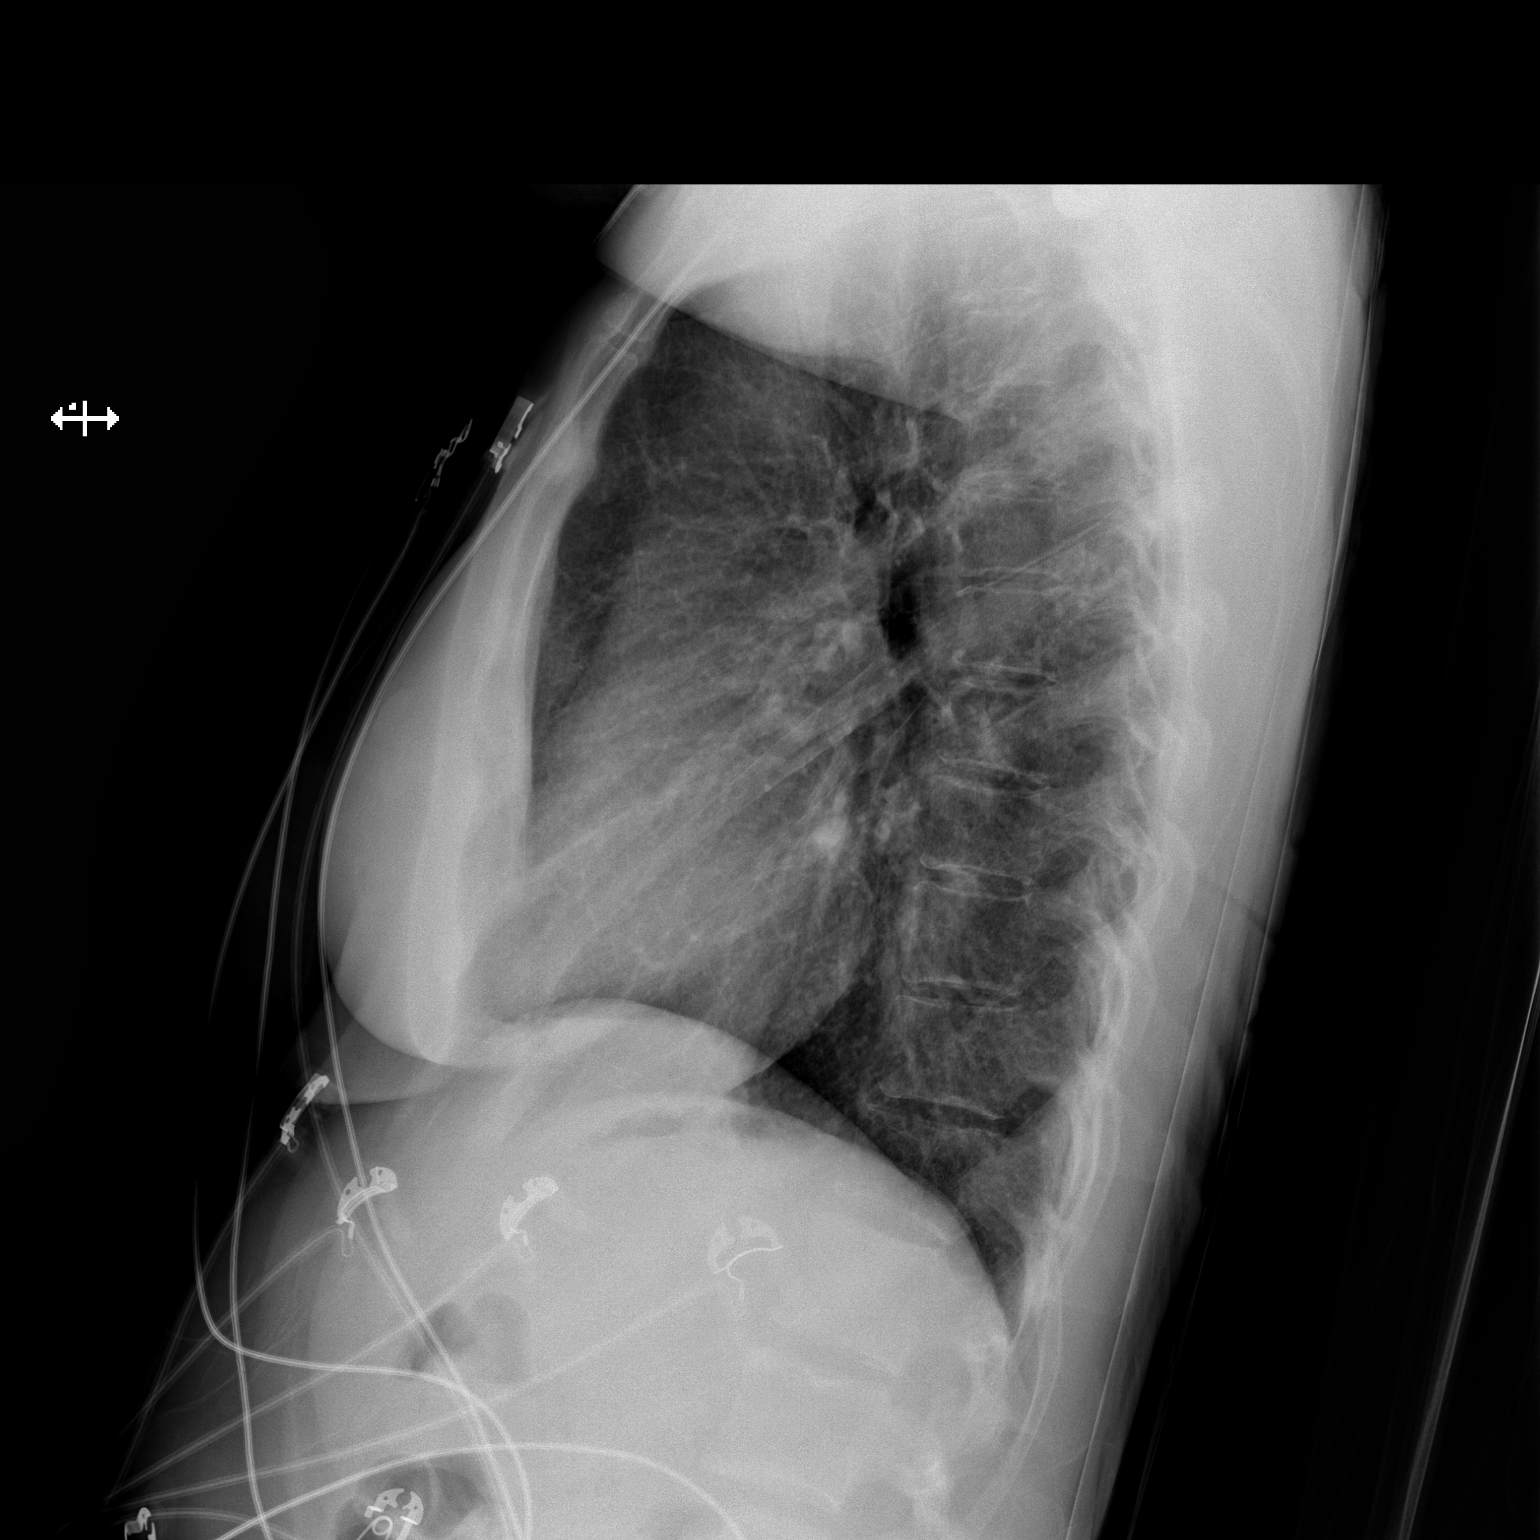

[x chest ap]
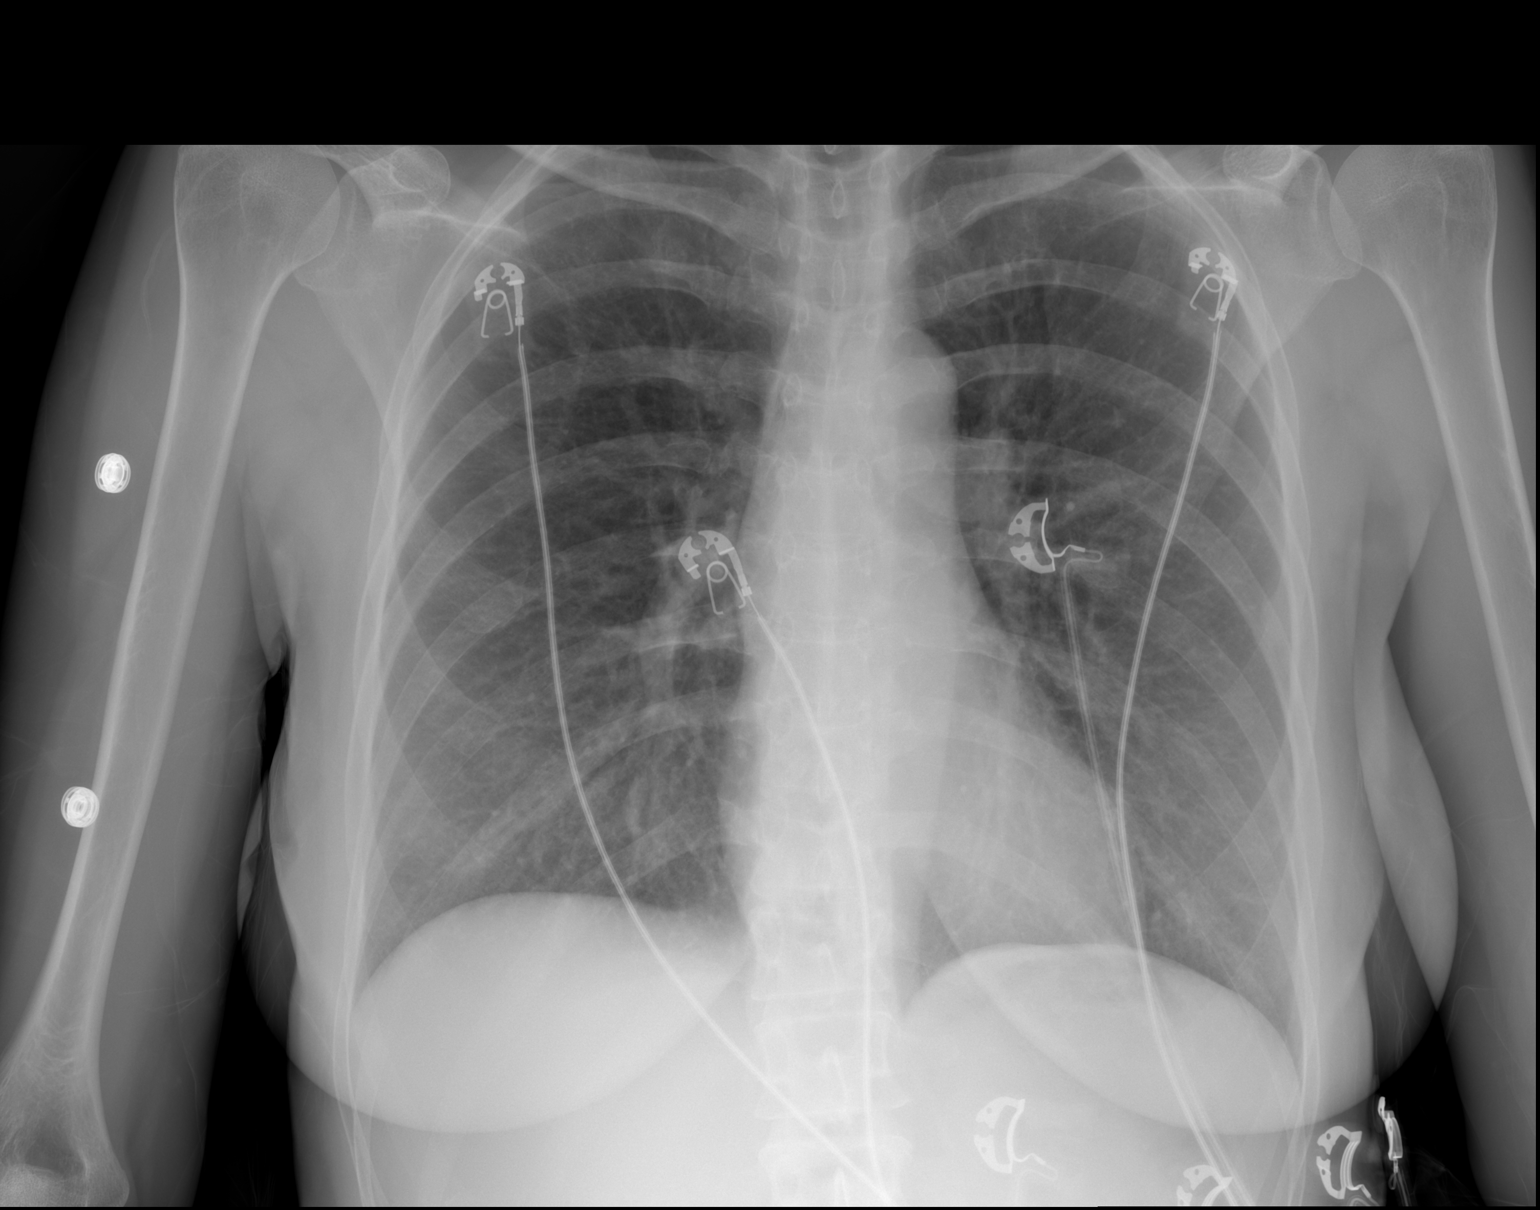

[2 of 2 positions shown; findings below may reference images not displayed]

FINDINGS: The cardiac silhouette, mediastinal and hilar contours are normal.
The lungs are clear. The bony thorax is intact.
IMPRESSION: No acute cardiopulmonary findings.

## 2015-04-09 ENCOUNTER — Other Ambulatory Visit: Payer: Managed Care, Other (non HMO)

## 2015-04-09 DIAGNOSIS — E785 Hyperlipidemia, unspecified: Secondary | ICD-10-CM

## 2015-04-09 DIAGNOSIS — Z0000 Encounter for general adult medical examination without abnormal findings: Secondary | ICD-10-CM

## 2015-04-09 DIAGNOSIS — F419 Anxiety disorder, unspecified: Secondary | ICD-10-CM

## 2015-04-09 DIAGNOSIS — Z79899 Other long term (current) drug therapy: Secondary | ICD-10-CM

## 2015-04-09 LAB — COMPLETE METABOLIC PANEL WITH GFR
ALT: 29 U/L (ref 6–29)
AST: 19 U/L (ref 10–35)
Albumin: 4.3 g/dL (ref 3.6–5.1)
Alkaline Phosphatase: 50 U/L (ref 33–130)
BILIRUBIN TOTAL: 0.4 mg/dL (ref 0.2–1.2)
BUN: 9 mg/dL (ref 7–25)
CALCIUM: 9.4 mg/dL (ref 8.6–10.4)
CHLORIDE: 107 mmol/L (ref 98–110)
CO2: 28 mmol/L (ref 20–31)
CREATININE: 0.62 mg/dL (ref 0.50–1.05)
GFR, Est African American: >89 mL/min (ref >=60)
GFR, Est Non African American: >89 mL/min (ref >=60)
Glucose, Bld: 99 mg/dL (ref 70–99)
Potassium: 4.7 mmol/L (ref 3.5–5.3)
Sodium: 139 mmol/L (ref 135–146)
TOTAL PROTEIN: 6.6 g/dL (ref 6.1–8.1)

## 2015-04-09 LAB — TSH: TSH: 1.29 m[IU]/L

## 2015-04-09 LAB — LIPID PANEL
Cholesterol: 201 mg/dL — ABNORMAL HIGH (ref 125–200)
HDL: 70 mg/dL (ref >=46)
LDL CALC: 112 mg/dL (ref <130)
Total CHOL/HDL Ratio: 2.9 Ratio (ref <=5.0)
Triglycerides: 97 mg/dL (ref <150)
VLDL: 19 mg/dL (ref <30)

## 2015-04-09 LAB — CBC WITH DIFFERENTIAL/PLATELET
Basophils Absolute: 0 10*3/uL (ref 0.0–0.1)
Basophils Relative: 0 % (ref 0–1)
Eosinophils Absolute: 0.1 10*3/uL (ref 0.0–0.7)
Eosinophils Relative: 2 % (ref 0–5)
HEMATOCRIT: 42.2 % (ref 36.0–46.0)
Hemoglobin: 13.8 g/dL (ref 12.0–15.0)
LYMPHS PCT: 47 % — AB (ref 12–46)
Lymphs Abs: 2.3 10*3/uL (ref 0.7–4.0)
MCH: 29.9 pg (ref 26.0–34.0)
MCHC: 32.7 g/dL (ref 30.0–36.0)
MCV: 91.5 fL (ref 78.0–100.0)
MONO ABS: 0.4 10*3/uL (ref 0.1–1.0)
MONOS PCT: 9 % (ref 3–12)
MPV: 10 fL (ref 8.6–12.4)
Neutro Abs: 2 10*3/uL (ref 1.7–7.7)
Neutrophils Relative %: 42 % — ABNORMAL LOW (ref 43–77)
Platelets: 345 10*3/uL (ref 150–400)
RBC: 4.61 MIL/uL (ref 3.87–5.11)
RDW: 14.3 % (ref 11.5–15.5)
WBC: 4.8 10*3/uL (ref 4.0–10.5)

## 2015-04-12 ENCOUNTER — Ambulatory Visit (INDEPENDENT_AMBULATORY_CARE_PROVIDER_SITE_OTHER): Payer: Managed Care, Other (non HMO) | Admitting: Physician Assistant

## 2015-04-12 ENCOUNTER — Encounter: Payer: Self-pay | Admitting: Physician Assistant

## 2015-04-12 VITALS — BP 114/78 | HR 60 | Temp 98.0°F | Resp 18 | Ht 62.25 in | Wt 123.0 lb

## 2015-04-12 DIAGNOSIS — F419 Anxiety disorder, unspecified: Secondary | ICD-10-CM | POA: Diagnosis not present

## 2015-04-12 DIAGNOSIS — F329 Major depressive disorder, single episode, unspecified: Secondary | ICD-10-CM

## 2015-04-12 DIAGNOSIS — E785 Hyperlipidemia, unspecified: Secondary | ICD-10-CM | POA: Diagnosis not present

## 2015-04-12 DIAGNOSIS — Z0000 Encounter for general adult medical examination without abnormal findings: Secondary | ICD-10-CM

## 2015-04-12 DIAGNOSIS — F32A Depression, unspecified: Secondary | ICD-10-CM

## 2015-04-12 NOTE — Progress Notes (Addendum)
Patient ID: Kelly Joseph MRN: [id number], DOB: 04-Aug-1960, 55 y.o. Date of Encounter: 04/12/2015,   Chief Complaint: Physical (CPE)  HPI: 55 y.o. y/o female  here for CPE.   When She was here for her physical in 2014, she requested that we increase her Wellbutrin dose as she was often feeling overwhelmed.  At f/u OV she said that this medication has worked very well for her. Feeling much better with this increased dose. Having no adverse effects. Rarely needs the Xanax but does like to have it on hand for when needed.  At OV--03/2014--- she states that she has been having some depression recently. Says that last Friday was a bad day she just cried and cried. Visit there was nothing going on that day for her to be upset about. Says that for the past 6 months she has been feeling anxious. Says that she will sometimes have a "sinking feeling "and feelings of feeling depressed " At that OV, added Zoloft 64m.  She had f/u OV. Reported that Zoloft was working very well. Was no longer feeling anxious or depressed. Was having no adverse effects.   Again today, she says that current medications are working well. States that she is not feeling anxiety or depression. Is having no adverse effects with medications.  AT PAST VISITS, WE HAVE ALSO PRESCRIBED:  --- Flexeril to still have available as needed for some back pain that is secondary to muscle strain.  ---- Zyrtec and Flonase for allergies.  She continues to exercise 5 or 6 days per week at home. She does different types of videos for aerobics and does some weight lifting as well. As well she is very cautious about her diet and eats a very healthy diet. Has no angina symptoms with her exercise.   Review of Systems: Consitutional: No fever, chills, fatigue, night sweats, lymphadenopathy. No significant/unexplained weight changes. Eyes: No visual changes, eye redness, or discharge. ENT/Mouth: No ear pain, sore throat, nasal drainage,  or sinus pain. Cardiovascular: No chest pressure,heaviness, tightness or squeezing, even with exertion. No increased shortness of breath or dyspnea on exertion.No palpitations, edema, orthopnea, PND. Respiratory: No cough, hemoptysis, SOB, or wheezing. Gastrointestinal: No anorexia, dysphagia, reflux, pain, nausea, vomiting, hematemesis, diarrhea, constipation, BRBPR, or melena. Breast: No mass, nodules, bulging, or retraction. No skin changes or inflammation. No nipple discharge. No lymphadenopathy. Genitourinary: No dysuria, hematuria, incontinence, vaginal discharge, pruritis, burning, abnormal bleeding, or pain. Musculoskeletal: No decreased ROM, No joint pain or swelling. No significant pain in neck, back, or extremities. Skin: No rash, pruritis, or concerning lesions. Neurological: No headache, dizziness, syncope, seizures, tremors, memory loss, coordination problems, or paresthesias. Psychological: No anxiety, depression, hallucinations, SI/HI. Endocrine: No polydipsia, polyphagia, polyuria, or known diabetes.No increased fatigue. No palpitations/rapid heart rate. No significant/unexplained weight change. All other systems were reviewed and are otherwise negative.  Past Medical History  Diagnosis Date  . Anxiety   . Depression   . Hyperlipidemia 03/14/2013     Past Surgical History  Procedure Laterality Date  . Mastopexy  1989/1999  . Liposuction  1999/2009  . Blepharoplasty  2006  . Resty    . Restylane  2005    Home Meds:  Current Outpatient Prescriptions on File Prior to Visit  Medication Sig Dispense Refill  . ALPRAZolam (XANAX) 0.25 MG tablet TAKE 1 TABLET EVERY DAY AS NEEDED 30 tablet 2  . buPROPion (WELLBUTRIN XL) 150 MG 24 hr tablet TAKE 1 TABLET (150 MG TOTAL) BY MOUTH DAILY. TAKE WITH  BUPROPION 300MG TO EQUAL 450MG TOTAL. 90 tablet 1  . buPROPion (WELLBUTRIN XL) 300 MG 24 hr tablet TAKE 1 TABLET (300 MG TOTAL) BY MOUTH DAILY. TAKE WITH BUPROPION 150MG TO EQUAL 450MG  TOTAL. 90 tablet 1  . cetirizine (ZYRTEC) 10 MG tablet Take 10 mg by mouth daily.    . Flaxseed, Linseed, (FLAX SEEDS PO) Take 1 tablet by mouth daily.     . fluticasone (FLONASE) 50 MCG/ACT nasal spray Place 2 sprays into both nostrils daily. 16 g 11  . omega-3 acid ethyl esters (LOVAZA) 1 G capsule Take 1 g by mouth daily.     . potassium chloride SA (K-DUR,KLOR-CON) 20 MEQ tablet Take 1 tablet (20 mEq total) by mouth daily. 3 tablet 0  . sertraline (ZOLOFT) 50 MG tablet Take 1 tablet (50 mg total) by mouth daily. 30 tablet 11  . simvastatin (ZOCOR) 10 MG tablet TAKE 1 TABLET AT BEDTIME 30 tablet 3   No current facility-administered medications on file prior to visit.    Allergies:  Allergies  Allergen Reactions  . Latex Hives  . Sulfa Antibiotics Hives    Social History   Social History  . Marital Status: Married    Spouse Name: N/A  . Number of Children: N/A  . Years of Education: N/A   Occupational History  . Not on file.   Social History Main Topics  . Smoking status: Former Smoker    Quit date: 09/23/1982  . Smokeless tobacco: Never Used  . Alcohol Use: Yes     Comment: socialy  . Drug Use: No  . Sexual Activity: Not on file   Other Topics Concern  . 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 Cancer Mother     breast ca  . Cancer Maternal Grandfather     ovarian ca  . Colon cancer Neg Hx   . Pancreatic cancer Neg Hx   . Stomach cancer Neg Hx     Physical Exam: Blood pressure 114/78, pulse 60, temperature 98 F (36.7 C), temperature source Oral, resp. rate 18, height 5' 2.25" (1.581 m), weight 123 lb (55.792 kg)., Body mass index is 22.32 kg/(m^2). General: Well developed, well nourished,WF. Appears in no acute distress. HEENT: Normocephalic, atraumatic. Conjunctiva pink, sclera non-icteric. Pupils 2 mm constricting to 1 mm, round,  regular, and equally reactive to light and accomodation. EOMI. Internal auditory canal clear. TMs with good cone of light and without pathology. Nasal mucosa pink. Nares are without discharge. No sinus tenderness. Oral mucosa pink.  Pharynx without exudate.   Neck: Supple. Trachea midline. No thyromegaly. Full ROM. No lymphadenopathy.No Carotid Bruits. Lungs: Clear to auscultation bilaterally without wheezes, rales, or rhonchi. Breathing is of normal effort and unlabored. Cardiovascular: RRR with S1 S2. No murmurs, rubs, or gallops. Distal pulses 2+ symmetrically. No carotid or abdominal bruits. Breast: Symmetrical. No masses. Nipples without discharge. Abdomen: Soft, non-tender, non-distended with normoactive bowel sounds. No hepatosplenomegaly or masses. No rebound/guarding. No CVA tenderness. No hernias.  Genitourinary:  External genitalia without lesions. Vaginal mucosa pink.No discharge present. Cervix pink and without discharge. No cervical tenderness.Normal uterus size. No adnexal mass or tenderness.  Musculoskeletal: Full range of motion and 5/5 strength throughout. Without swelling, atrophy, tenderness, crepitus, or warmth. Extremities without clubbing, cyanosis, or edema.  Skin: Warm and moist without erythema, ecchymosis,  wounds, or rash. Neuro: A+Ox3. CN II-XII grossly intact. Moves all extremities spontaneously. Full sensation throughout. Normal gait.  Psych:  Responds to questions appropriately with a normal affect.   Assessment/Plan:  55 y.o. y/o female here for CPE 1. Encounter for preventive health examination   A. Screening Labs: Results for orders placed or performed in visit on 04/09/15  COMPLETE METABOLIC PANEL WITH GFR  Result Value Ref Range   Sodium 139 135 - 146 mmol/L   Potassium 4.7 3.5 - 5.3 mmol/L   Chloride 107 98 - 110 mmol/L   CO2 28 20 - 31 mmol/L   Glucose, Bld 99 70 - 99 mg/dL   BUN 9 7 - 25 mg/dL   Creat 0.62 0.50 - 1.05 mg/dL   Total Bilirubin 0.4 0.2  - 1.2 mg/dL   Alkaline Phosphatase 50 33 - 130 U/L   AST 19 10 - 35 U/L   ALT 29 6 - 29 U/L   Total Protein 6.6 6.1 - 8.1 g/dL   Albumin 4.3 3.6 - 5.1 g/dL   Calcium 9.4 8.6 - 10.4 mg/dL   GFR, Est African American >89 >=60 mL/min   GFR, Est Non African American >89 >=60 mL/min  TSH  Result Value Ref Range   TSH 1.29 mIU/L  CBC with Differential/Platelet  Result Value Ref Range   WBC 4.8 4.0 - 10.5 K/uL   RBC 4.61 3.87 - 5.11 MIL/uL   Hemoglobin 13.8 12.0 - 15.0 g/dL   HCT 42.2 36.0 - 46.0 %   MCV 91.5 78.0 - 100.0 fL   MCH 29.9 26.0 - 34.0 pg   MCHC 32.7 30.0 - 36.0 g/dL   RDW 14.3 11.5 - 15.5 %   Platelets 345 150 - 400 K/uL   MPV 10.0 8.6 - 12.4 fL   Neutrophils Relative % 42 (L) 43 - 77 %   Neutro Abs 2.0 1.7 - 7.7 K/uL   Lymphocytes Relative 47 (H) 12 - 46 %   Lymphs Abs 2.3 0.7 - 4.0 K/uL   Monocytes Relative 9 3 - 12 %   Monocytes Absolute 0.4 0.1 - 1.0 K/uL   Eosinophils Relative 2 0 - 5 %   Eosinophils Absolute 0.1 0.0 - 0.7 K/uL   Basophils Relative 0 0 - 1 %   Basophils Absolute 0.0 0.0 - 0.1 K/uL   Smear Review Criteria for review not met   Lipid panel  Result Value Ref Range   Cholesterol 201 (H) 125 - 200 mg/dL   Triglycerides 97 <150 mg/dL   HDL 70 >=46 mg/dL   Total CHOL/HDL Ratio 2.9 <=5.0 Ratio   VLDL 19 <30 mg/dL   LDL Cholesterol 112 <130 mg/dL     B. Pap: Last Pap smear was performed 05/21/2011. Both cytology and HPV co testing were negative. Repeat Pap today--04/2015  C. Screening Mammogram: She states that she always has her mammogram at the same time as her physical. States she has mammogram scheduled--for 2 weeks from now.  Addendum--Received Mammogram Report from [facility]---Performed 05/02/2015---Negative.  D. Colorectal Cancer Screening:   At her CPE 03/2013 she reported  that she had already gone for screening colonoscopy recently.  Michela Pitcher it was completely normal and can wait 10 years to repeat.  F. Immunizations:  Tetanus: States that she  had this performed on 10/14/2009 Zostavax: Will discuss at age 36 Pneumonia Vaccine: Will discuss at age 88. She has no indication to require this prior to age 43. Influenza vaccine--- she says that she  never gets the influenza vaccine and defers again today.   2. Anxiety At OV 03/2014--She said that she has never been on an SSRI. She said that in the past Dr. Harriett Rush had prescribed Wellbutrin and it had always worked very well for her. That's why she had simply requested increased dose of that medication in the past. At [place] 03/2014-- added Zoloft 50 mg. Anxiety and Depression have been well controlled since then--with Wellbutrin and Zoloft 73m.   3. Depression At OV 03/2014--She said that she has never been on an SSRI. She said that in the past Dr. MHarriett Rushhad prescribed Wellbutrin and it had always worked very well for her. That's why she had simply requested increased dose of that medication in the past. [place] added Zoloft 50 mg. Anxiety and Depression have been well controlled since then--with Wellbutrin and Zoloft 530m   4. Back pain She uses Flexeril rarely but wants to have this on hand in case she needs it  5. Allergic rhinitis Cont Zyrtec and Flonase PRN  6. Hyperlipidemia At her CPE 03/2013---Discussed her lipid panel Discussed that she has no cardiac risk factors. However she said it would be impossible for her to improve her diet and exercise any further than what she is doing. LDL was 184. She was agreeable to start low-dose statin. started simvastatin (ZOCOR) 10 MG tablet at that time.  FLP now excellent.  LFTs normal.     F/U Office visit 6 months or sooner if needed.  Signed, [email or web address] 04/12/2015 2:22 PM

## 2015-04-17 ENCOUNTER — Other Ambulatory Visit: Payer: Self-pay | Admitting: Physician Assistant

## 2015-04-17 NOTE — Telephone Encounter (Signed)
Medication refilled per protocol. 

## 2015-04-18 LAB — PAP, THIN PREP W/HPV RFLX HPV TYPE 16/18: HPV DNA High Risk: NOT DETECTED

## 2015-04-24 ENCOUNTER — Telehealth: Payer: Self-pay | Admitting: Physician Assistant

## 2015-04-24 NOTE — Telephone Encounter (Signed)
[facility]  Patient calling to get refill on her mupirocin  If possible

## 2015-04-24 NOTE — Telephone Encounter (Signed)
Pt states hydrocortisone doesn't work.  Also has tried OTC abx creams.  Says is very sensitive.  States this works the best.  Had big tube from last refill has lasted her this long.

## 2015-04-24 NOTE — Telephone Encounter (Signed)
Mupirocin cream that used for her Eczema.  Said used to get from Dr Ree Edman, sure she has had refilled by [place].  Do not see anywhere in chart!!  OK?

## 2015-04-24 NOTE — Telephone Encounter (Signed)
That medication is used to treat skin infections.  If she has eczema, use otc hydrocortisone cream.

## 2015-04-25 MED ORDER — MUPIROCIN CALCIUM 2 % EX CREA: 1.0000 "application " | TOPICAL_CREAM | Freq: Three times a day (TID) | CUTANEOUS | Status: DC

## 2015-04-25 NOTE — Telephone Encounter (Signed)
Mupirocin Cream 2% Apply 3 times a day for 7 days Disp # 30 grams + 1

## 2015-04-25 NOTE — Telephone Encounter (Signed)
Left pt message that Rx sent to pharmacy

## 2015-05-02 LAB — HM MAMMOGRAPHY

## 2015-05-08 ENCOUNTER — Encounter: Payer: Self-pay | Admitting: Family Medicine

## 2015-05-15 ENCOUNTER — Other Ambulatory Visit: Payer: Self-pay | Admitting: Physician Assistant

## 2015-05-15 NOTE — Telephone Encounter (Signed)
Refill appropriate and filled per protocol. 

## 2015-06-12 ENCOUNTER — Other Ambulatory Visit: Payer: Self-pay | Admitting: Physician Assistant

## 2015-06-12 NOTE — Telephone Encounter (Signed)
Medication refilled per protocol. 

## 2015-09-30 ENCOUNTER — Other Ambulatory Visit: Payer: Self-pay | Admitting: Physician Assistant

## 2015-11-25 ENCOUNTER — Other Ambulatory Visit: Payer: Self-pay | Admitting: Physician Assistant

## 2015-11-27 NOTE — Telephone Encounter (Signed)
Rx refilled per protocol 

## 2016-03-25 ENCOUNTER — Other Ambulatory Visit: Payer: Self-pay | Admitting: Physician Assistant

## 2016-03-25 NOTE — Telephone Encounter (Signed)
Refill appropriate 

## 2016-04-21 ENCOUNTER — Other Ambulatory Visit: Payer: Self-pay | Admitting: Physician Assistant

## 2016-04-21 NOTE — Telephone Encounter (Signed)
Refill appropriate 

## 2016-05-07 ENCOUNTER — Encounter: Payer: Self-pay | Admitting: Physician Assistant

## 2016-05-07 ENCOUNTER — Ambulatory Visit (INDEPENDENT_AMBULATORY_CARE_PROVIDER_SITE_OTHER): Payer: Managed Care, Other (non HMO) | Admitting: Physician Assistant

## 2016-05-07 VITALS — BP 110/80 | HR 63 | Temp 98.0°F | Resp 18 | Ht 63.0 in | Wt 122.8 lb

## 2016-05-07 DIAGNOSIS — F419 Anxiety disorder, unspecified: Secondary | ICD-10-CM | POA: Diagnosis not present

## 2016-05-07 DIAGNOSIS — F324 Major depressive disorder, single episode, in partial remission: Secondary | ICD-10-CM | POA: Diagnosis not present

## 2016-05-07 DIAGNOSIS — Z0000 Encounter for general adult medical examination without abnormal findings: Secondary | ICD-10-CM

## 2016-05-07 DIAGNOSIS — E785 Hyperlipidemia, unspecified: Secondary | ICD-10-CM | POA: Diagnosis not present

## 2016-05-07 NOTE — Progress Notes (Signed)
Patient ID: Kelly Joseph MRN: [id number], DOB: 08/26/1960, 56 y.o. Date of Encounter: 05/07/2016,   Chief Complaint: Physical (CPE)  HPI: 56 y.o. y/o female  here for CPE.   When She was here for her physical in 2014, 56 y.o. she requested that we increase her Wellbutrin dose as she was often feeling overwhelmed.  At f/u OV she said that this medication has worked very well for her. Feeling much better with this increased dose. Having no adverse effects. Rarely needs the Xanax but does like to have it on hand for when needed.  At OV--03/2014--- she states that she has been having some depression recently. Says that last Friday was a bad day she just cried and cried. Visit there was nothing going on that day for her to be upset about. Says that for the past 6 months she has been feeling anxious. Says that she will sometimes have a "sinking feeling "and feelings of feeling depressed " At that OV, added Zoloft 50mg .  She had f/u OV. Reported that Zoloft was working very well. Was no longer feeling anxious or depressed. Was having no adverse effects.   Again today, she says that current medications are working well. States that she is not feeling anxiety or depression. Is having no adverse effects with medications.  AT PAST VISITS, WE HAVE ALSO PRESCRIBED:  --- Flexeril to still have available as needed for some back pain that is secondary to muscle strain.  ---- Zyrtec and Flonase for allergies.   05/07/2016:  When asked about meds for anxiety, depression---She reports that current meds are "the right cocktail for me --- for right now anyway". Says these meds are working perfectly for her---keeping her anxiety controlled and her depression is well controlled. Meds are causing no adverse effects. She is taking her simvastatin as directed. No myalgias or other adverse effects.  She continues to exercise 5 or 6 days per week at home. She does different types of videos for aerobics and does some  weight lifting as well. As well she is very cautious about her diet and eats a very healthy diet. 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 Kelly Joseph is where Kelly Joseph.  Has no angina symptoms with her exercise.   Review of Systems: Consitutional: No fever, chills, fatigue, night sweats, lymphadenopathy. No significant/unexplained weight changes. Eyes: No visual changes, eye redness, or discharge. ENT/Mouth: No ear pain, sore throat, nasal drainage, or sinus pain. Cardiovascular: No chest pressure,heaviness, tightness or squeezing, even with exertion. No increased shortness of breath or dyspnea on exertion.No palpitations, edema, orthopnea, PND. Respiratory: No cough, hemoptysis, SOB, or wheezing. Gastrointestinal: No anorexia, dysphagia, reflux, pain, nausea, vomiting, hematemesis, diarrhea, constipation, BRBPR, or melena. Breast: No mass, nodules, bulging, or retraction. No skin changes or inflammation. No nipple discharge. No lymphadenopathy. Genitourinary: No dysuria, hematuria, incontinence, vaginal discharge, pruritis, burning, abnormal bleeding, or pain. Musculoskeletal: No decreased ROM, No joint pain or swelling. No significant pain in neck, back, or extremities. Skin: No rash, pruritis, or concerning lesions. Neurological: No headache, dizziness, syncope, seizures, tremors, memory loss, coordination problems, or paresthesias. Psychological: No anxiety, depression, hallucinations, SI/HI. Endocrine: No polydipsia, polyphagia, polyuria, or known diabetes.No increased fatigue. No palpitations/rapid heart rate. No significant/unexplained weight change. All other systems were reviewed and are otherwise negative.  Past Medical History:  Diagnosis Date  . Anxiety   . Depression   . Hyperlipidemia 03/14/2013     Past Surgical History:  Procedure Laterality Date  .  BLEPHAROPLASTY  2006  . LIPOSUCTION  1999/2009  . MASTOPEXY  1989/1999  . resty      . restylane  2005    Home Meds:  Current Outpatient Prescriptions on File Prior to Visit  Medication Sig Dispense Refill  . ALPRAZolam (XANAX) 0.25 MG tablet TAKE 1 TABLET EVERY DAY AS NEEDED 30 tablet 2  . buPROPion (WELLBUTRIN XL) 150 MG 24 hr tablet TAKE 1 TABLET (150 MG TOTAL) BY MOUTH DAILY. TAKE WITH BUPROPION 300MG  TO EQUAL 450MG  TOTAL. 90 tablet 1  . buPROPion (WELLBUTRIN XL) 300 MG 24 hr tablet TAKE 1 TABLET (300 MG TOTAL) BY MOUTH DAILY. TAKE WITH BUPROPION 150MG  TO EQUAL 450MG  TOTAL. 90 tablet 1  . cetirizine (ZYRTEC) 10 MG tablet Take 10 mg by mouth daily.    . Flaxseed, Linseed, (FLAX SEEDS PO) Take 1 tablet by mouth daily.     . fluticasone (FLONASE) 50 MCG/ACT nasal spray Place 2 sprays into both nostrils daily. 16 g 11  . mupirocin cream (BACTROBAN) 2 % Apply 1 application topically 3 (three) times daily. X 7 days 30 g 1  . omega-3 acid ethyl esters (LOVAZA) 1 G capsule Take 1 g by mouth daily.     . potassium chloride SA (K-DUR,KLOR-CON) 20 MEQ tablet Take 1 tablet (20 mEq total) by mouth daily. 3 tablet 0  . sertraline (ZOLOFT) 50 MG tablet TAKE 1 TABLET (50 MG TOTAL) BY MOUTH DAILY. 30 tablet 0  . simvastatin (ZOCOR) 10 MG tablet TAKE 1 TABLET AT BEDTIME 90 tablet 1   No current facility-administered medications on file prior to visit.     Allergies:  Allergies  Allergen Reactions  . Latex Hives  . Sulfa Antibiotics Hives    Social History   Social History  . Marital status: Married    Spouse name: N/A  . Number of children: N/A  . Years of education: N/A   Occupational History  . Not on file.   Social History Main Topics  . Smoking status: Former Smoker    Quit date: 09/23/1982  . Smokeless tobacco: Never Used  . Alcohol use Yes     Comment: socialy  . Drug use: No  . Sexual activity: Not on file   Other Topics Concern  . 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 Cancer Mother     breast ca  . Cancer Maternal Grandfather     ovarian ca  . Colon cancer Neg Hx   . Pancreatic cancer Neg Hx   . Stomach cancer Neg Hx     Physical Exam: Blood pressure 110/80, pulse 63, temperature 98 F (36.7 C), temperature source Oral, resp. rate 18, height 5\' 3"  (1.6 m), weight 122 lb 12.8 oz (55.7 kg), SpO2 98 %., Body mass index is 21.75 kg/m. General: Well developed, well nourished,WF. Appears in no acute distress. HEENT: Normocephalic, atraumatic. Conjunctiva pink, sclera non-icteric. Pupils 2 mm constricting to 1 mm, round, regular, and equally reactive to light and accomodation. EOMI. Internal auditory canal clear. TMs with good cone of light and without pathology. Nasal mucosa pink. Nares are without discharge. No sinus tenderness. Oral mucosa pink.  Pharynx without exudate.   Neck: Supple. Trachea midline. No thyromegaly. Full ROM. No lymphadenopathy.No Carotid Bruits. Lungs: Clear to auscultation bilaterally without wheezes, rales, or rhonchi. Breathing is  of normal effort and unlabored. Cardiovascular: RRR with S1 S2. No murmurs, rubs, or gallops. Distal pulses 2+ symmetrically. No carotid or abdominal bruits. Breast: Symmetrical. No masses. Nipples without discharge. Abdomen: Soft, non-tender, non-distended with normoactive bowel sounds. No hepatosplenomegaly or masses. No rebound/guarding. No CVA tenderness. No hernias.  Genitourinary:  External genitalia without lesions. Vaginal mucosa pink.No discharge present. Cervix pink and without discharge. No cervical tenderness.Normal uterus size. No adnexal mass or tenderness.  Musculoskeletal: Full range of motion and 5/5 strength throughout. Without swelling, atrophy, tenderness, crepitus, or warmth. Extremities without clubbing, cyanosis, or edema.  Skin: Warm and moist without erythema, ecchymosis, wounds, or rash. Neuro: A+Ox3. CN II-XII grossly intact. Moves all  extremities spontaneously. Full sensation throughout. Normal gait.  Psych:  Responds to questions appropriately with a normal affect.   Assessment/Plan:  56 y.o. y/o female here for CPE 1. Encounter for preventive health examination   A. Screening Labs: 05/07/2016: She Is not fasting today but will return fasting for labs tomorrow morning.  B. Pap: 05/07/2016: Last Pap smear was performed 04/12/2015. Both cytology and HPV co testing were negative.  C. Screening Mammogram: 05/07/2016: She states that she always has her mammogram at the same time as her physical. States she has mammogram scheduled for this Friday.    D. Colorectal Cancer Screening:   At her CPE 03/2013 she reported  that she had already gone for screening colonoscopy recently.  Michela Pitcher it was completely normal and can wait 10 years to repeat. 05/07/2016: Reviewed above. UpToDate  F. Immunizations:  Tetanus: States that she had this performed on 10/14/2009 Zostavax: Will discuss at age 24 Pneumonia Vaccine: Will discuss at age 18. She has no indication to require this prior to age 62. Influenza vaccine--- she says that she never gets the influenza vaccine and defers again today. 05/07/2016: Reviewed Immunizations--UpToDate  2. Anxiety At OV 03/2014--She said that she has never been on an SSRI. She said that in the past Dr. Harriett Rush had prescribed Wellbutrin and it had always worked very well for her. That's why she had simply requested increased dose of that medication in the past. At [place] 03/2014-- added Zoloft 50 mg. Anxiety and Depression have been well controlled since then--with Wellbutrin and Zoloft 50mg .  05/07/2016: Anxiety and depression are well controlled. Continue current dose of Zoloft and Wellbutrin.  3. Depression At OV 03/2014--She said that she has never been on an SSRI. She said that in the past Dr. Harriett Rush had prescribed Wellbutrin and it had always worked very well for her. That's why she had simply requested  increased dose of that medication in the past. At [place] 03/2014-- added Zoloft 50 mg. Anxiety and Depression have been well controlled since then--with Wellbutrin and Zoloft 50mg .  05/07/2016: Anxiety and depression are well controlled. Continue current dose of Zoloft and Wellbutrin.  4. Back pain 05/07/2016:She uses Flexeril rarely but wants to have this on hand in case she needs it  5. Allergic rhinitis 05/07/2016:Cont Zyrtec and Flonase PRN  6. Hyperlipidemia At her CPE 03/2013---Discussed her lipid panel Discussed that she has no cardiac risk factors. However she said it would be impossible for her to improve her diet and exercise any further than what she is doing. LDL was 184. She was agreeable to start low-dose statin. started simvastatin (ZOCOR) 10 MG tablet at that time.  FLP now excellent.  LFTs normal.  05/07/2016: She is on simvastatin. She will return fasting for labs tomorrow morning.   F/U Office visit  6 months or sooner if needed.  Signed, [address] [place], [place], [facility] 05/07/2016 10:56 AM

## 2016-05-08 ENCOUNTER — Other Ambulatory Visit: Payer: Managed Care, Other (non HMO)

## 2016-05-08 DIAGNOSIS — Z0000 Encounter for general adult medical examination without abnormal findings: Secondary | ICD-10-CM

## 2016-05-08 DIAGNOSIS — E785 Hyperlipidemia, unspecified: Secondary | ICD-10-CM

## 2016-05-08 LAB — CBC WITH DIFFERENTIAL/PLATELET
Basophils Absolute: 0 cells/uL (ref 0–200)
Basophils Relative: 0 %
EOS PCT: 2 %
Eosinophils Absolute: 94 cells/uL (ref 15–500)
HCT: 41.8 % (ref 35.0–45.0)
Hemoglobin: 13.8 g/dL (ref 12.0–15.0)
LYMPHS PCT: 49 %
Lymphs Abs: 2303 cells/uL (ref 850–3900)
MCH: 30.5 pg (ref 27.0–33.0)
MCHC: 33 g/dL (ref 32.0–36.0)
MCV: 92.3 fL (ref 80.0–100.0)
MONO ABS: 329 {cells}/uL (ref 200–950)
MPV: 9.7 fL (ref 7.5–12.5)
Monocytes Relative: 7 %
Neutro Abs: 1974 cells/uL (ref 1500–7800)
Neutrophils Relative %: 42 %
PLATELETS: 318 10*3/uL (ref 140–400)
RBC: 4.53 MIL/uL (ref 3.80–5.10)
RDW: 13.5 % (ref 11.0–15.0)
WBC: 4.7 10*3/uL (ref 3.8–10.8)

## 2016-05-08 LAB — LIPID PANEL
CHOLESTEROL: 199 mg/dL (ref <200)
HDL: 67 mg/dL (ref >50)
LDL Cholesterol: 113 mg/dL — ABNORMAL HIGH (ref <100)
Total CHOL/HDL Ratio: 3 Ratio (ref <5.0)
Triglycerides: 97 mg/dL (ref <150)
VLDL: 19 mg/dL (ref <30)

## 2016-05-08 LAB — COMPLETE METABOLIC PANEL WITH GFR
ALK PHOS: 42 U/L (ref 33–130)
ALT: 15 U/L (ref 6–29)
AST: 16 U/L (ref 10–35)
Albumin: 4.3 g/dL (ref 3.6–5.1)
BUN: 9 mg/dL (ref 7–25)
CHLORIDE: 104 mmol/L (ref 98–110)
CO2: 28 mmol/L (ref 20–31)
CREATININE: 0.73 mg/dL (ref 0.50–1.05)
Calcium: 9.3 mg/dL (ref 8.6–10.4)
GFR, Est African American: >89 mL/min (ref >=60)
GFR, Est Non African American: >89 mL/min (ref >=60)
GLUCOSE: 87 mg/dL (ref 70–99)
POTASSIUM: 4.6 mmol/L (ref 3.5–5.3)
SODIUM: 139 mmol/L (ref 135–146)
Total Bilirubin: 0.4 mg/dL (ref 0.2–1.2)
Total Protein: 6.5 g/dL (ref 6.1–8.1)

## 2016-05-08 LAB — TSH: TSH: 2.43 mIU/L

## 2016-05-09 LAB — VITAMIN D 25 HYDROXY (VIT D DEFICIENCY, FRACTURES): Vit D, 25-Hydroxy: 33 ng/mL (ref 30–100)

## 2016-05-09 LAB — HM MAMMOGRAPHY

## 2016-05-20 ENCOUNTER — Encounter: Payer: Self-pay | Admitting: Family Medicine

## 2016-05-21 ENCOUNTER — Other Ambulatory Visit: Payer: Self-pay | Admitting: Physician Assistant

## 2016-05-21 NOTE — Telephone Encounter (Signed)
Refill appropriate 

## 2016-05-22 ENCOUNTER — Other Ambulatory Visit: Payer: Self-pay | Admitting: Physician Assistant

## 2016-05-30 ENCOUNTER — Other Ambulatory Visit: Payer: Self-pay | Admitting: Physician Assistant

## 2016-05-30 NOTE — Telephone Encounter (Signed)
Ok to refill 

## 2016-05-31 NOTE — Telephone Encounter (Signed)
Approved. #[id number]. 

## 2016-06-02 NOTE — Telephone Encounter (Signed)
Rx called in to pharmacy. 

## 2016-06-26 ENCOUNTER — Other Ambulatory Visit: Payer: Self-pay | Admitting: Physician Assistant

## 2016-06-26 NOTE — Telephone Encounter (Signed)
Refill appropriate 

## 2016-07-24 ENCOUNTER — Other Ambulatory Visit: Payer: Self-pay | Admitting: Physician Assistant

## 2016-07-24 NOTE — Telephone Encounter (Signed)
Refill appropriate 

## 2016-09-20 ENCOUNTER — Other Ambulatory Visit: Payer: Self-pay | Admitting: Physician Assistant

## 2016-09-22 NOTE — Telephone Encounter (Signed)
Refill appropriate 

## 2016-10-25 ENCOUNTER — Other Ambulatory Visit: Payer: Self-pay | Admitting: Physician Assistant

## 2016-10-27 NOTE — Telephone Encounter (Signed)
Refill appropriate 

## 2017-01-24 ENCOUNTER — Other Ambulatory Visit: Payer: Self-pay | Admitting: Physician Assistant

## 2017-04-25 ENCOUNTER — Other Ambulatory Visit: Payer: Self-pay | Admitting: Physician Assistant

## 2017-05-11 ENCOUNTER — Other Ambulatory Visit: Payer: 59

## 2017-05-11 ENCOUNTER — Other Ambulatory Visit: Payer: Self-pay

## 2017-05-11 DIAGNOSIS — Z0000 Encounter for general adult medical examination without abnormal findings: Secondary | ICD-10-CM

## 2017-05-12 LAB — CBC WITH DIFFERENTIAL/PLATELET
Basophils Absolute: 29 cells/uL (ref 0–200)
Basophils Relative: 0.6 %
EOS PCT: 3.3 %
Eosinophils Absolute: 158 cells/uL (ref 15–500)
HEMATOCRIT: 40.6 % (ref 35.0–45.0)
Hemoglobin: 13.6 g/dL (ref 11.7–15.5)
Lymphs Abs: 2376 cells/uL (ref 850–3900)
MCH: 30.2 pg (ref 27.0–33.0)
MCHC: 33.5 g/dL (ref 32.0–36.0)
MCV: 90 fL (ref 80.0–100.0)
MONOS PCT: 6.9 %
MPV: 10.4 fL (ref 7.5–12.5)
NEUTROS PCT: 39.7 %
Neutro Abs: 1906 cells/uL (ref 1500–7800)
Platelets: 328 10*3/uL (ref 140–400)
RBC: 4.51 10*6/uL (ref 3.80–5.10)
RDW: 12.5 % (ref 11.0–15.0)
TOTAL LYMPHOCYTE: 49.5 %
WBC mixed population: 331 cells/uL (ref 200–950)
WBC: 4.8 10*3/uL (ref 3.8–10.8)

## 2017-05-12 LAB — LIPID PANEL
CHOL/HDL RATIO: 3.4 (calc) (ref <5.0)
CHOLESTEROL: 230 mg/dL — AB (ref <200)
HDL: 67 mg/dL (ref >50)
LDL Cholesterol (Calc): 143 mg/dL (calc) — ABNORMAL HIGH
Non-HDL Cholesterol (Calc): 163 mg/dL (calc) — ABNORMAL HIGH (ref <130)
Triglycerides: 91 mg/dL (ref <150)

## 2017-05-12 LAB — HEPATITIS C ANTIBODY
HEP C AB: NONREACTIVE
SIGNAL TO CUT-OFF: 0.01 (ref <1.00)

## 2017-05-12 LAB — COMPLETE METABOLIC PANEL WITH GFR
AG RATIO: 2.2 (calc) (ref 1.0–2.5)
ALBUMIN MSPROF: 4.3 g/dL (ref 3.6–5.1)
ALT: 17 U/L (ref 6–29)
AST: 17 U/L (ref 10–35)
Alkaline phosphatase (APISO): 42 U/L (ref 33–130)
BUN: 8 mg/dL (ref 7–25)
CHLORIDE: 106 mmol/L (ref 98–110)
CO2: 28 mmol/L (ref 20–32)
Calcium: 9.2 mg/dL (ref 8.6–10.4)
Creat: 0.63 mg/dL (ref 0.50–1.05)
GFR, EST AFRICAN AMERICAN: 118 mL/min/{1.73_m2} (ref >=60)
GFR, Est Non African American: 102 mL/min/{1.73_m2} (ref >=60)
Globulin: 2 g/dL (calc) (ref 1.9–3.7)
Glucose, Bld: 101 mg/dL — ABNORMAL HIGH (ref 65–99)
POTASSIUM: 4.8 mmol/L (ref 3.5–5.3)
SODIUM: 142 mmol/L (ref 135–146)
TOTAL PROTEIN: 6.3 g/dL (ref 6.1–8.1)
Total Bilirubin: 0.3 mg/dL (ref 0.2–1.2)

## 2017-05-12 LAB — HIV ANTIBODY (ROUTINE TESTING W REFLEX): HIV 1&2 Ab, 4th Generation: NONREACTIVE

## 2017-05-12 LAB — TSH: TSH: 1 m[IU]/L

## 2017-05-14 ENCOUNTER — Other Ambulatory Visit: Payer: Self-pay

## 2017-05-14 ENCOUNTER — Encounter: Payer: Self-pay | Admitting: Physician Assistant

## 2017-05-14 ENCOUNTER — Ambulatory Visit (INDEPENDENT_AMBULATORY_CARE_PROVIDER_SITE_OTHER): Payer: 59 | Admitting: Physician Assistant

## 2017-05-14 VITALS — BP 112/78 | HR 66 | Temp 98.3°F | Resp 14 | Ht 63.25 in | Wt 120.2 lb

## 2017-05-14 DIAGNOSIS — F419 Anxiety disorder, unspecified: Secondary | ICD-10-CM | POA: Diagnosis not present

## 2017-05-14 DIAGNOSIS — F329 Major depressive disorder, single episode, unspecified: Secondary | ICD-10-CM | POA: Diagnosis not present

## 2017-05-14 DIAGNOSIS — Z0000 Encounter for general adult medical examination without abnormal findings: Secondary | ICD-10-CM | POA: Diagnosis not present

## 2017-05-14 DIAGNOSIS — E785 Hyperlipidemia, unspecified: Secondary | ICD-10-CM | POA: Diagnosis not present

## 2017-05-14 MED ORDER — SERTRALINE HCL 100 MG PO TABS: 100.0000 mg | ORAL_TABLET | Freq: Every day | ORAL | 11 refills | Status: DC

## 2017-05-14 NOTE — Progress Notes (Signed)
Patient ID: Kelly Joseph MRN: [id number], DOB: 07-Jul-1960, 57 y.o. Date of Encounter: 05/14/2017,   Chief Complaint: Physical (CPE)  HPI: 57 y.o. y/o female  here for CPE.   When She was here for her physical in 57, she requested that we increase her Wellbutrin dose as she was often feeling overwhelmed.  At f/u OV she said that this medication has worked very well for her. Feeling much better with this increased dose. Having no adverse effects. Rarely needs the Xanax but does like to have it on hand for when needed.  At OV--03/2014--- she states that she has been having some depression recently. Says that last Friday was a bad day she just cried and cried. Visit there was nothing going on that day for her to be upset about. Says that for the past 6 months she has been feeling anxious. Says that she will sometimes have a "sinking feeling "and feelings of feeling depressed " At that OV, added Zoloft 50mg .  She had f/u OV. Reported that Zoloft was working very well. Was no longer feeling anxious or depressed. Was having no adverse effects.   Again today, she says that current medications are working well. States that she is not feeling anxiety or depression. Is having no adverse effects with medications.  AT PAST VISITS, WE HAVE ALSO PRESCRIBED:  --- Flexeril to still have available as needed for some back pain that is secondary to muscle strain.  ---- Zyrtec and Flonase for allergies.   05/07/2016:  When asked about meds for anxiety, depression---She reports that current meds are "the right cocktail for me --- for right now anyway". Says these meds are working perfectly for her---keeping her anxiety controlled and her depression is well controlled. Meds are causing no adverse effects. She is taking her simvastatin as directed. No myalgias or other adverse effects.  She continues to exercise 5 or 6 days per week at home. She does different types of videos for aerobics and does some  weight lifting as well. As well she is very cautious about her diet and eats a very healthy diet. 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 [employer] is where [name] and [employer].  Has no angina symptoms with her exercise.   05/14/2017: Today she reports that her father has had a stroke and she has been back and forth to [facility] for 2 years and it is finally really taking a toll on her mentally.  Says that her parents are 86 and 72 years old and she tries to tell them things that would make her life easier but they do not listen.  Says that on average she goes to [place] about twice a week.  Says that she usually will spend a night and then help do errands the next day.  Says sometimes she leaves her parents, starts crying, goes to work.  Sometimes at those times will take a Xanax and that gets her through.  Says that she does have a sister who is in [place] who does try to help with them as well.  Says "this is one reason we moved to this side of [place] -- so I can get back and forth to [place]".   Says that she does feel that I need to increase dose on her medication.  Otherwise she has no other concerns to address today.  Says that otherwise things have been stable and she has been feeling fine. She does continue to take  the simvastatin for cholesterol daily.  This is causing no myalgias or other adverse effects.   Review of Systems: Consitutional: No fever, chills, fatigue, night sweats, lymphadenopathy. No significant/unexplained weight changes. Eyes: No visual changes, eye redness, or discharge. ENT/Mouth: No ear pain, sore throat, nasal drainage, or sinus pain. Cardiovascular: No chest pressure,heaviness, tightness or squeezing, even with exertion. No increased shortness of breath or dyspnea on exertion.No palpitations, edema, orthopnea, PND. Respiratory: No cough, hemoptysis, SOB, or wheezing. Gastrointestinal: No anorexia, dysphagia, reflux, pain, nausea,  vomiting, hematemesis, diarrhea, constipation, BRBPR, or melena. Breast: No mass, nodules, bulging, or retraction. No skin changes or inflammation. No nipple discharge. No lymphadenopathy. Genitourinary: No dysuria, hematuria, incontinence, vaginal discharge, pruritis, burning, abnormal bleeding, or pain. Musculoskeletal: No decreased ROM, No joint pain or swelling. No significant pain in neck, back, or extremities. Skin: No rash, pruritis, or concerning lesions. Neurological: No headache, dizziness, syncope, seizures, tremors, memory loss, coordination problems, or paresthesias. Psychological: No  hallucinations, SI/HI. Endocrine: No polydipsia, polyphagia, polyuria, or known diabetes.No increased fatigue. No palpitations/rapid heart rate. No significant/unexplained weight change. All other systems were reviewed and are otherwise negative.  Past Medical History:  Diagnosis Date  . Anxiety   . Depression   . Hyperlipidemia 03/14/2013     Past Surgical History:  Procedure Laterality Date  . BLEPHAROPLASTY  2006  . LIPOSUCTION  1999/2009  . MASTOPEXY  1989/1999  . resty    . restylane  2005    Home Meds:  Current Outpatient Medications on File Prior to Visit  Medication Sig Dispense Refill  . ALPRAZolam (XANAX) 0.25 MG tablet TAKE 1 TABLET BY MOUTH EVERY DAY AS NEEDED TO LAST 30 DAYS 30 tablet 2  . Ascorbic Acid (VITAMIN C) 1000 MG tablet Take 1,000 mg by mouth daily.    Marland Kitchen buPROPion (WELLBUTRIN XL) 150 MG 24 hr tablet TAKE 1 TABLET BY MOUTH DAILY**TAKE WITH BUPROPION 300MG  TO EQUAL 450MG  TOTAL. 90 tablet 3  . buPROPion (WELLBUTRIN XL) 300 MG 24 hr tablet TAKE 1 TABLET BY MOUTH DAILY**TAKE WITH BUPROPION 150MG  TO EQUAL 450MG  TOTAL. 90 tablet 3  . Calcium-Magnesium-Zinc 333-133-5 MG TABS Take 1 tablet by mouth daily.    . cetirizine (ZYRTEC) 10 MG tablet Take 10 mg by mouth daily.    . Flaxseed, Linseed, (FLAX SEEDS PO) Take 1 tablet by mouth daily.     . fluticasone (FLONASE) 50  MCG/ACT nasal spray Place 2 sprays into both nostrils daily. 16 g 11  . mupirocin cream (BACTROBAN) 2 % Apply 1 application topically 3 (three) times daily. X 7 days 30 g 1  . omega-3 acid ethyl esters (LOVAZA) 1 G capsule Take 1 g by mouth daily.     . potassium chloride SA (K-DUR,KLOR-CON) 20 MEQ tablet Take 1 tablet (20 mEq total) by mouth daily. 3 tablet 0  . simvastatin (ZOCOR) 10 MG tablet TAKE 1 TABLET AT BEDTIME 90 tablet 3   No current facility-administered medications on file prior to visit.     Allergies:  Allergies  Allergen Reactions  . Latex Hives  . Sulfa Antibiotics Hives    Social History   Socioeconomic History  . Marital status: Married    Spouse name: Not on file  . Number of children: Not on file  . Years of education: Not on file  . Highest education level: Not on file  Occupational History  . Not on file  Social Needs  . Financial resource strain: Not on file  . Food insecurity:  Worry: Not on file    Inability: Not on file  . Transportation needs:    Medical: Not on file    Non-medical: Not on file  Tobacco Use  . Smoking status: Former Smoker    Last attempt to quit: 09/23/1982    Years since quitting: 34.6  . Smokeless tobacco: Never Used  Substance and Sexual Activity  . Alcohol use: Yes    Comment: socialy  . Drug use: No  . Sexual activity: Not on file  Lifestyle  . Physical activity:    Days per week: Not on file    Minutes per session: Not on file  . Stress: Not on file  Relationships  . Social connections:    Talks on phone: Not on file    Gets together: Not on file    Attends religious service: Not on file    Active member of club or organization: Not on file    Attends meetings of clubs or organizations: Not on file    Relationship status: Not on file  . Intimate partner violence:    Fear of current or ex partner: Not on file    Emotionally abused: Not on file    Physically abused: Not on file    Forced sexual activity:  Not on file  Other Topics Concern  . Not on file  Social History Narrative   Cosmetologist.   Exercises at home 5-6 days per week   No children    Family History  Problem Relation Age of Onset  . Cancer Mother        breast ca  . Cancer Maternal Grandfather        ovarian ca  . Colon cancer Neg Hx   . Pancreatic cancer Neg Hx   . Stomach cancer Neg Hx     Physical Exam: Blood pressure 112/78, pulse 66, temperature 98.3 F (36.8 C), temperature source Oral, resp. rate 14, height 5' 3.25" (1.607 m), weight 54.5 kg (120 lb 3.2 oz), SpO2 97 %., Body mass index is 21.12 kg/m. General: Well developed, well nourished,WF. Appears in no acute distress. HEENT: Normocephalic, atraumatic. Conjunctiva pink, sclera non-icteric. Pupils 2 mm constricting to 1 mm, round, regular, and equally reactive to light and accomodation. EOMI. Internal auditory canal clear. TMs with good cone of light and without pathology. Nasal mucosa pink. Nares are without discharge. No sinus tenderness. Oral mucosa pink. Neck: Supple. Trachea midline. No thyromegaly. Full ROM. No lymphadenopathy.No Carotid Bruits. Lungs: Clear to auscultation bilaterally without wheezes, rales, or rhonchi. Breathing is of normal effort and unlabored. Cardiovascular: RRR with S1 S2. No murmurs, rubs, or gallops. Distal pulses 2+ symmetrically. No carotid or abdominal bruits. Breast: Symmetrical. No masses. Nipples without discharge. Abdomen: Soft, non-tender, non-distended with normoactive bowel sounds. No hepatosplenomegaly or masses. No rebound/guarding. No CVA tenderness. No hernias.  Genitourinary:  External genitalia without lesions. Vaginal mucosa pink.No discharge present. Cervix pink and without discharge. No cervical tenderness.Normal uterus size. No adnexal mass or tenderness.  Musculoskeletal: Full range of motion and 5/5 strength throughout. Without swelling, atrophy, tenderness, crepitus, or warmth. Extremities without clubbing,  cyanosis, or edema.  Skin: Warm and moist without erythema, ecchymosis, wounds, or rash. Neuro: A+Ox3. CN II-XII grossly intact. Moves all extremities spontaneously. Full sensation throughout. Normal gait.  Psych:  Responds to questions appropriately with a normal affect.   Assessment/Plan:  57 y.o. y/o female here for CPE  1. Encounter for preventive health examination   A. Screening Labs: 05/14/2017: She  recently came fasting for labs.  We reviewed those results today.  All labs are normal.  B. Pap: 05/14/2017:  Last Pap smear was performed 04/12/2015. Both cytology and HPV co testing were negative.  C. Screening Mammogram:  05/14/2017:  She states that she always has her mammogram at the same time as her physical. States she just had mammogram in the past week.  I have not yet received that report.   D. Colorectal Cancer Screening:   At her CPE 03/2013 she reported  that she had already gone for screening colonoscopy recently.  Michela Pitcher it was completely normal and can wait 10 years to repeat. 05/14/2017: Reviewed above. UpToDate  F. Immunizations:  Tetanus: States that she had this performed on 10/14/2009 Pneumonia Vaccine: Will discuss at age 61. She has no indication to require this prior to age 79. Influenza vaccine--- she says that she never gets the influenza vaccine and defers again today. 05/14/2017: Discussed, Recommend Shingrix--- she is to check with her insurance regarding coverage and cost and if she wants to proceed with this--- will get this at pharmacy.  2. Anxiety At OV 03/2014--She said that she has never been on an SSRI. She said that in the past Dr. Harriett Rush had prescribed Wellbutrin and it had always worked very well for her. That's why she had simply requested increased dose of that medication in the past. At [place] 03/2014-- added Zoloft 50 mg. Anxiety and Depression have been well controlled since then--with Wellbutrin and Zoloft 50mg .  05/07/2016: Anxiety and depression  are well controlled. Continue current dose of Zoloft and Wellbutrin. 05/14/2017: She is already on high dose of Wellbutrin so will continue current dose of Wellbutrin.  Will increase her Zoloft from 50 mg to 100 mg.  She is to follow-up if this causes any adverse effects or if symptoms are not improved over the upcoming 1-2 months.  3. Depression At OV 03/2014--She said that she has never been on an SSRI. She said that in the past Dr. Harriett Rush had prescribed Wellbutrin and it had always worked very well for her. That's why she had simply requested increased dose of that medication in the past. At [place] 03/2014-- added Zoloft 50 mg. Anxiety and Depression have been well controlled since then--with Wellbutrin and Zoloft 50mg .  05/07/2016: Anxiety and depression are well controlled. Continue current dose of Zoloft and Wellbutrin. 05/14/2017: She is already on high dose of Wellbutrin so will continue current dose of Wellbutrin.  Will increase her Zoloft from 50 mg to 100 mg.  She is to follow-up if this causes any adverse effects or if symptoms are not improved over the upcoming 1-2 months.  4. Back pain 05/14/2017: She uses Flexeril rarely but wants to have this on hand in case she needs it  5. Allergic rhinitis 05/14/2017:  Cont Zyrtec and Flonase PRN  6. Hyperlipidemia At her CPE 03/2013---Discussed her lipid panel Discussed that she has no cardiac risk factors. However she said it would be impossible for her to improve her diet and exercise any further than what she is doing. LDL was 184. She was agreeable to start low-dose statin. started simvastatin (ZOCOR) 10 MG tablet at that time.  FLP now excellent.  LFTs normal.  05/07/2016: She is on simvastatin. She will return fasting for labs tomorrow morning. 05/14/2017: She recently came for fasting labs.  Reviewed those results today.  Continue current dose simvastatin.  F/U Office visit 6 months or sooner if needed.  Signed, Karis Juba,  PA,  BSFM 05/14/2017 11:08 AM

## 2017-05-24 ENCOUNTER — Other Ambulatory Visit: Payer: Self-pay | Admitting: Physician Assistant

## 2017-07-16 ENCOUNTER — Telehealth: Payer: Self-pay

## 2017-07-16 DIAGNOSIS — D179 Benign lipomatous neoplasm, unspecified: Secondary | ICD-10-CM

## 2017-07-16 NOTE — Telephone Encounter (Signed)
Place referral to general surgery.

## 2017-07-16 NOTE — Telephone Encounter (Signed)
Patient called and left a message requesting to see a surgeon to have the knot on her left side removed. Patient states the knot is below her left breast and if she take her elbow and pull towards her rib cage she can touch it. Patient states it does not hurt and has not grown in size since her last visit with you. She is just ready to have it removed. She was referred to a surgeon back in 2014 but it was not removed

## 2017-07-17 NOTE — Telephone Encounter (Signed)
Referral has been placed. 

## 2017-08-07 ENCOUNTER — Telehealth: Payer: Self-pay | Admitting: Physician Assistant

## 2017-08-07 NOTE — Telephone Encounter (Signed)
Patient left vm requesting a refill on her flerixil to [facility] on [address]. She states she has been having back spasms again. She states the last time she had refill was back in 2015.  CB# [phone]

## 2017-08-10 MED ORDER — CYCLOBENZAPRINE HCL 10 MG PO TABS: 10.0000 mg | ORAL_TABLET | Freq: Three times a day (TID) | ORAL | 2 refills | Status: DC | PRN

## 2017-08-10 NOTE — Telephone Encounter (Signed)
Rx sent to pharmacy   

## 2017-08-17 ENCOUNTER — Other Ambulatory Visit: Payer: Self-pay | Admitting: Physician Assistant

## 2017-08-17 NOTE — Telephone Encounter (Signed)
Last OV 05/14/2017 Last refill 06/02/2017 Ok to refill?

## 2017-09-12 ENCOUNTER — Other Ambulatory Visit: Payer: Self-pay | Admitting: Physician Assistant

## 2018-02-26 ENCOUNTER — Other Ambulatory Visit: Payer: Self-pay

## 2018-02-26 DIAGNOSIS — F419 Anxiety disorder, unspecified: Principal | ICD-10-CM

## 2018-02-26 DIAGNOSIS — F329 Major depressive disorder, single episode, unspecified: Secondary | ICD-10-CM

## 2018-02-26 MED ORDER — SERTRALINE HCL 100 MG PO TABS: 100.0000 mg | ORAL_TABLET | Freq: Every day | ORAL | 0 refills | Status: DC

## 2018-02-26 NOTE — Progress Notes (Signed)
Patient called in stating that she has misplaced her Zoloft prescription. She believes that she accidentally threw it away. She was requesting that we send in a new prescription so that she could pick it up. Medication sent into pharmacy. Patient was notified and verbalized understanding.

## 2018-03-08 ENCOUNTER — Other Ambulatory Visit: Payer: Self-pay | Admitting: *Deleted

## 2018-03-08 MED ORDER — ALPRAZOLAM 0.25 MG PO TABS: 0.2500 mg | ORAL_TABLET | Freq: Every day | ORAL | 2 refills | Status: DC | PRN

## 2018-03-08 NOTE — Telephone Encounter (Signed)
Received fax requesting refill on Xanax,   Ok to refill??  Last office visit4/12/2017.  Last refill 08/17/2017, #2 refills.  Of note, letter sent to patient to establish with new provider.

## 2018-03-21 ENCOUNTER — Other Ambulatory Visit: Payer: Self-pay | Admitting: Family Medicine

## 2018-03-21 DIAGNOSIS — F329 Major depressive disorder, single episode, unspecified: Secondary | ICD-10-CM

## 2018-03-21 DIAGNOSIS — F419 Anxiety disorder, unspecified: Principal | ICD-10-CM

## 2018-04-30 ENCOUNTER — Other Ambulatory Visit: Payer: Self-pay | Admitting: Family Medicine

## 2018-04-30 DIAGNOSIS — F329 Major depressive disorder, single episode, unspecified: Secondary | ICD-10-CM

## 2018-04-30 DIAGNOSIS — F419 Anxiety disorder, unspecified: Principal | ICD-10-CM

## 2018-05-04 ENCOUNTER — Other Ambulatory Visit: Payer: Self-pay | Admitting: *Deleted

## 2018-05-04 DIAGNOSIS — F419 Anxiety disorder, unspecified: Principal | ICD-10-CM

## 2018-05-04 DIAGNOSIS — F329 Major depressive disorder, single episode, unspecified: Secondary | ICD-10-CM

## 2018-05-04 NOTE — Telephone Encounter (Signed)
Received fax requesting refill on Sertraline.   Ok to refill?  Patient has not been seen since MBD.

## 2018-05-05 MED ORDER — SERTRALINE HCL 100 MG PO TABS: 100.0000 mg | ORAL_TABLET | Freq: Every day | ORAL | 1 refills | Status: DC

## 2018-05-05 NOTE — Telephone Encounter (Signed)
Yes, of course- we can do the 90 d with 1 refill.   Are we rescheduling the CPEs? We may want to set up a encounter/telephone visit to check in with her since its been a year and she hadn't established with one of [place] yet.

## 2018-05-17 ENCOUNTER — Other Ambulatory Visit: Payer: 59

## 2018-05-18 ENCOUNTER — Other Ambulatory Visit: Payer: 59

## 2018-05-18 ENCOUNTER — Other Ambulatory Visit: Payer: Self-pay

## 2018-05-18 DIAGNOSIS — Z0000 Encounter for general adult medical examination without abnormal findings: Secondary | ICD-10-CM

## 2018-05-18 LAB — CBC WITH DIFFERENTIAL/PLATELET
Absolute Monocytes: 271 cells/uL (ref 200–950)
Basophils Absolute: 30 cells/uL (ref 0–200)
Basophils Relative: 0.7 %
Eosinophils Absolute: 112 cells/uL (ref 15–500)
Eosinophils Relative: 2.6 %
HCT: 41 % (ref 35.0–45.0)
Hemoglobin: 13.6 g/dL (ref 11.7–15.5)
Lymphs Abs: 2055 cells/uL (ref 850–3900)
MCH: 30.1 pg (ref 27.0–33.0)
MCHC: 33.2 g/dL (ref 32.0–36.0)
MCV: 90.7 fL (ref 80.0–100.0)
MPV: 11 fL (ref 7.5–12.5)
Monocytes Relative: 6.3 %
Neutro Abs: 1832 cells/uL (ref 1500–7800)
Neutrophils Relative %: 42.6 %
Platelets: 204 10*3/uL (ref 140–400)
RBC: 4.52 10*6/uL (ref 3.80–5.10)
RDW: 12.6 % (ref 11.0–15.0)
Total Lymphocyte: 47.8 %
WBC: 4.3 10*3/uL (ref 3.8–10.8)

## 2018-05-18 LAB — COMPREHENSIVE METABOLIC PANEL
AG Ratio: 2 (calc) (ref 1.0–2.5)
ALT: 12 U/L (ref 6–29)
AST: 16 U/L (ref 10–35)
Albumin: 4.3 g/dL (ref 3.6–5.1)
Alkaline phosphatase (APISO): 49 U/L (ref 37–153)
BUN: 10 mg/dL (ref 7–25)
CO2: 29 mmol/L (ref 20–32)
Calcium: 9.3 mg/dL (ref 8.6–10.4)
Chloride: 104 mmol/L (ref 98–110)
Creat: 0.65 mg/dL (ref 0.50–1.05)
Globulin: 2.1 g/dL (calc) (ref 1.9–3.7)
Glucose, Bld: 96 mg/dL (ref 65–99)
Potassium: 5 mmol/L (ref 3.5–5.3)
Sodium: 140 mmol/L (ref 135–146)
Total Bilirubin: 0.4 mg/dL (ref 0.2–1.2)
Total Protein: 6.4 g/dL (ref 6.1–8.1)

## 2018-05-18 LAB — LIPID PANEL
Cholesterol: 187 mg/dL (ref <200)
HDL: 56 mg/dL (ref >=50)
LDL Cholesterol (Calc): 106 mg/dL (calc) — ABNORMAL HIGH
Non-HDL Cholesterol (Calc): 131 mg/dL (calc) — ABNORMAL HIGH (ref <130)
Total CHOL/HDL Ratio: 3.3 (calc) (ref <5.0)
Triglycerides: 132 mg/dL (ref <150)

## 2018-05-19 ENCOUNTER — Other Ambulatory Visit: Payer: Self-pay

## 2018-05-20 ENCOUNTER — Encounter: Payer: Self-pay | Admitting: Family Medicine

## 2018-05-20 ENCOUNTER — Other Ambulatory Visit: Payer: Self-pay

## 2018-05-20 ENCOUNTER — Ambulatory Visit (INDEPENDENT_AMBULATORY_CARE_PROVIDER_SITE_OTHER): Payer: 59 | Admitting: Family Medicine

## 2018-05-20 VITALS — BP 100/62 | HR 79 | Temp 99.0°F | Resp 18 | Ht 64.0 in | Wt 121.6 lb

## 2018-05-20 DIAGNOSIS — F419 Anxiety disorder, unspecified: Secondary | ICD-10-CM

## 2018-05-20 DIAGNOSIS — F329 Major depressive disorder, single episode, unspecified: Secondary | ICD-10-CM

## 2018-05-20 DIAGNOSIS — Z1239 Encounter for other screening for malignant neoplasm of breast: Secondary | ICD-10-CM

## 2018-05-20 DIAGNOSIS — E785 Hyperlipidemia, unspecified: Secondary | ICD-10-CM

## 2018-05-20 DIAGNOSIS — Z0000 Encounter for general adult medical examination without abnormal findings: Secondary | ICD-10-CM | POA: Diagnosis not present

## 2018-05-20 MED ORDER — BUPROPION HCL ER (XL) 150 MG PO TB24: ORAL_TABLET | ORAL | 3 refills | Status: DC

## 2018-05-20 MED ORDER — BUPROPION HCL ER (XL) 300 MG PO TB24: ORAL_TABLET | ORAL | 3 refills | Status: DC

## 2018-05-20 MED ORDER — SIMVASTATIN 10 MG PO TABS: 10.0000 mg | ORAL_TABLET | Freq: Every day | ORAL | 3 refills | Status: DC

## 2018-05-20 NOTE — Progress Notes (Signed)
Patient: Kelly Joseph, Female    DOB: 1960/06/19, 58 y.o.   MRN: [id number] Visit Date: 05/20/2018  Today's Provider: Delsa Grana, PA-C   Chief Complaint  Patient presents with  . Annual Exam   Subjective:    Annual physical exam Kelly Joseph is a 58 y.o. female who presents today for health maintenance and complete physical. She feels well. She reports exercising aerobics at least 3 x a week. She reports she is sleeping well.  -----------------------------------------------------------------  The 10-year ASCVD risk score Kelly Joseph Kelly Joseph., et al., 2013) is: 1.1%   Values used to calculate the score:     Age: 71 years     Sex: Female     Is Non-Hispanic African American: No     Diabetic: No     Tobacco smoker: No     Systolic Blood Pressure: 272 mmHg     Is BP treated: No     HDL Cholesterol: 56 mg/dL     Total Cholesterol: 187 mg/dL   Anxiety and Depression screening - Doing well on the 450 mg wellbutrin and zoloft 100 mg, xanax used sparingly.  Feels good, no SI, HI, AVH - PHQ and GAD 7 done here today.  Cholesterol med no SE, denies myalgias or weakness, she "watches what she eats", exercises, no weight changes or concerns.   Mammogram due  Labs completed prior to appt, printed and reviewed with pt today Results for orders placed or performed in visit on 05/18/18  Lipid Panel  Result Value Ref Range   Cholesterol 187 <200 mg/dL   HDL 56 > OR = 50 mg/dL   Triglycerides 132 <150 mg/dL   LDL Cholesterol (Calc) 106 (H) mg/dL (calc)   Total CHOL/HDL Ratio 3.3 <5.0 (calc)   Non-HDL Cholesterol (Calc) 131 (H) <130 mg/dL (calc)  Comprehensive metabolic panel  Result Value Ref Range   Glucose, Bld 96 65 - 99 mg/dL   BUN 10 7 - 25 mg/dL   Creat 0.65 0.50 - 1.05 mg/dL   BUN/Creatinine Ratio NOT APPLICABLE 6 - 22 (calc)   Sodium 140 135 - 146 mmol/L   Potassium 5.0 3.5 - 5.3 mmol/L   Chloride 104 98 - 110 mmol/L   CO2 29 20 - 32 mmol/L   Calcium 9.3 8.6 -  10.4 mg/dL   Total Protein 6.4 6.1 - 8.1 g/dL   Albumin 4.3 3.6 - 5.1 g/dL   Globulin 2.1 1.9 - 3.7 g/dL (calc)   AG Ratio 2.0 1.0 - 2.5 (calc)   Total Bilirubin 0.4 0.2 - 1.2 mg/dL   Alkaline phosphatase (APISO) 49 37 - 153 U/L   AST 16 10 - 35 U/L   ALT 12 6 - 29 U/L  CBC with Differential/Platelet  Result Value Ref Range   WBC 4.3 3.8 - 10.8 Thousand/uL   RBC 4.52 3.80 - 5.10 Million/uL   Hemoglobin 13.6 11.7 - 15.5 g/dL   HCT 41.0 35.0 - 45.0 %   MCV 90.7 80.0 - 100.0 fL   MCH 30.1 27.0 - 33.0 pg   MCHC 33.2 32.0 - 36.0 g/dL   RDW 12.6 11.0 - 15.0 %   Platelets 204 140 - 400 Thousand/uL   MPV 11.0 7.5 - 12.5 fL   Neutro Abs 1,832 1,500 - 7,800 cells/uL   Lymphs Abs 2,055 850 - 3,900 cells/uL   Absolute Monocytes 271 200 - 950 cells/uL   Eosinophils Absolute 112 15 - 500 cells/uL   Basophils Absolute 30 0 - 200  cells/uL   Neutrophils Relative % 42.6 %   Total Lymphocyte 47.8 %   Monocytes Relative 6.3 %   Eosinophils Relative 2.6 %   Basophils Relative 0.7 %     Review of Systems  Constitutional: Negative.  Negative for activity change, appetite change, chills, diaphoresis, fatigue and unexpected weight change.  HENT: Negative.   Eyes: Negative.   Respiratory: Negative.   Cardiovascular: Negative.   Gastrointestinal: Negative.   Endocrine: Negative.   Genitourinary: Negative.   Musculoskeletal: Negative.   Skin: Negative.   Allergic/Immunologic: Positive for environmental allergies.  Neurological: Negative.   Hematological: Negative.   Psychiatric/Behavioral: Negative.  Negative for agitation, dysphoric mood, sleep disturbance and suicidal ideas. The patient is not nervous/anxious.   All other systems reviewed and are negative.   Social History      She  reports that she quit smoking about 35 years ago. She has a 1.25 pack-year smoking history. She has never used smokeless tobacco. She reports current alcohol use. She reports that she does not use drugs.        Social History   Socioeconomic History  . Marital status: Married    Spouse name: Not on file  . Number of children: Not on file  . Years of education: Not on file  . Highest education level: Not on file  Occupational History  . Not on file  Social Needs  . Financial resource strain: Not on file  . Food insecurity:    Worry: Not on file    Inability: Not on file  . Transportation needs:    Medical: Not on file    Non-medical: Not on file  Tobacco Use  . Smoking status: Former Smoker    Packs/day: 0.25    Years: 5.00    Pack years: 1.25    Last attempt to quit: 09/23/1982    Years since quitting: 35.6  . Smokeless tobacco: Never Used  Substance and Sexual Activity  . Alcohol use: Yes    Comment: socialy  . Drug use: No  . Sexual activity: Yes    Birth control/protection: Post-menopausal  Lifestyle  . Physical activity:    Days per week: 3 days    Minutes per session: 30 min  . Stress: Not on file  Relationships  . Social connections:    Talks on phone: Not on file    Gets together: Not on file    Attends religious service: Not on file    Active member of club or organization: Not on file    Attends meetings of clubs or organizations: Not on file    Relationship status: Not on file  Other Topics Concern  . Not on file  Social History Narrative   Cosmetologist.   Exercises at home 3 d /week   Taking care of granparent and mother ill   No children    Past Medical History:  Diagnosis Date  . Anxiety   . Depression   . Hyperlipidemia 03/14/2013     Patient Active Problem List   Diagnosis Date Noted  . Back pain 03/14/2013  . Allergic rhinitis 03/14/2013  . Hyperlipidemia 03/14/2013  . Anxiety and depression   . Depression     Past Surgical History:  Procedure Laterality Date  . BLEPHAROPLASTY  2006  . LIPOSUCTION  1999/2009  . MASTOPEXY  1989/1999  . resty    . restylane  2005    Family History        Family Status  Relation  Name Status  .  Mother  Alive  . MGF  Deceased  . Father  Alive  . Sister  Alive  . Brother  Alive  . MGM  (Not Specified)  . Neg Hx  (Not Specified)        Her family history includes Cancer in her maternal grandmother and mother; Stroke (age of onset: 25) in her father. There is no history of Colon cancer, Pancreatic cancer, or Stomach cancer.      Allergies  Allergen Reactions  . Latex Hives  . Sulfa Antibiotics Hives     Current Outpatient Medications:  .  ALPRAZolam (XANAX) 0.25 MG tablet, Take 1 tablet (0.25 mg total) by mouth daily as needed., Disp: 30 tablet, Rfl: 2 .  Ascorbic Acid (VITAMIN C) 1000 MG tablet, Take 1,000 mg by mouth daily., Disp: , Rfl:  .  buPROPion (WELLBUTRIN XL) 150 MG 24 hr tablet, TAKE 1 TABLET BY MOUTH DAILY**TAKE WITH BUPROPION 300MG  TO EQUAL 450MG  TOTAL., Disp: 90 tablet, Rfl: 3 .  buPROPion (WELLBUTRIN XL) 300 MG 24 hr tablet, TAKE 1 TABLET BY MOUTH DAILY**TAKE WITH BUPROPION 150MG  TO EQUAL 450MG  TOTAL., Disp: 90 tablet, Rfl: 3 .  Calcium-Magnesium-Zinc 333-133-5 MG TABS, Take 1 tablet by mouth daily., Disp: , Rfl:  .  cetirizine (ZYRTEC) 10 MG tablet, Take 10 mg by mouth daily., Disp: , Rfl:  .  cyclobenzaprine (FLEXERIL) 10 MG tablet, Take 1 tablet (10 mg total) by mouth 3 (three) times daily as needed for muscle spasms., Disp: 30 tablet, Rfl: 2 .  Flaxseed, Linseed, (FLAX SEEDS PO), Take 1 tablet by mouth daily. , Disp: , Rfl:  .  fluticasone (FLONASE) 50 MCG/ACT nasal spray, Place 2 sprays into both nostrils daily., Disp: 16 g, Rfl: 11 .  mupirocin cream (BACTROBAN) 2 %, Apply 1 application topically 3 (three) times daily. X 7 days, Disp: 30 g, Rfl: 1 .  omega-3 acid ethyl esters (LOVAZA) 1 G capsule, Take 1 g by mouth daily. , Disp: , Rfl:  .  potassium chloride SA (K-DUR,KLOR-CON) 20 MEQ tablet, Take 1 tablet (20 mEq total) by mouth daily., Disp: 3 tablet, Rfl: 0 .  sertraline (ZOLOFT) 100 MG tablet, Take 1 tablet (100 mg total) by mouth daily., Disp: 90  tablet, Rfl: 1 .  simvastatin (ZOCOR) 10 MG tablet, Take 1 tablet (10 mg total) by mouth at bedtime., Disp: 90 tablet, Rfl: 3   Patient Care Team: Delsa Grana, PA-C as PCP - General (Family Medicine)      Objective:   Vitals: BP 100/62   Pulse 79   Temp 99 F (37.2 C)   Resp 18   Ht 5\' 4"  (1.626 m)   Wt 121 lb 9.6 oz (55.2 kg)   SpO2 96%   BMI 20.87 kg/m    Vitals:   05/20/18 1413  BP: 100/62  Pulse: 79  Resp: 18  Temp: 99 F (37.2 C)  SpO2: 96%  Weight: 121 lb 9.6 oz (55.2 kg)  Height: 5\' 4"  (1.626 m)     Physical Exam Vitals signs and nursing note reviewed.  Constitutional:      General: She is not in acute distress.    Appearance: Normal appearance. She is well-developed and normal weight. She is not ill-appearing, toxic-appearing or diaphoretic.  HENT:     Head: Normocephalic and atraumatic.     Right Ear: Tympanic membrane, ear canal and external ear normal.     Left Ear: Tympanic membrane, ear canal and external  ear normal.     Nose: Nose normal. No congestion or rhinorrhea.     Mouth/Throat:     Mouth: Mucous membranes are moist.     Pharynx: Uvula midline. Posterior oropharyngeal erythema present. No oropharyngeal exudate.  Eyes:     General: Lids are normal. No scleral icterus.       Right eye: No discharge.        Left eye: No discharge.     Conjunctiva/sclera: Conjunctivae normal.     Pupils: Pupils are equal, round, and reactive to light.  Neck:     Musculoskeletal: Normal range of motion and neck supple.     Trachea: Phonation normal. No tracheal deviation.  Cardiovascular:     Rate and Rhythm: Normal rate and regular rhythm.     Pulses: Normal pulses.          Radial pulses are 2+ on the right side and 2+ on the left side.       Posterior tibial pulses are 2+ on the right side and 2+ on the left side.     Heart sounds: Normal heart sounds. No murmur. No friction rub. No gallop.   Pulmonary:     Effort: Pulmonary effort is normal. No  respiratory distress.     Breath sounds: Normal breath sounds. No stridor. No wheezing, rhonchi or rales.  Chest:     Chest wall: No tenderness.  Abdominal:     General: Bowel sounds are normal. There is no distension.     Palpations: Abdomen is soft.     Tenderness: There is no abdominal tenderness. There is no guarding or rebound.  Musculoskeletal: Normal range of motion.        General: No deformity.     Right lower leg: No edema.     Left lower leg: No edema.  Lymphadenopathy:     Cervical: No cervical adenopathy.  Skin:    General: Skin is warm and dry.     Capillary Refill: Capillary refill takes less than 2 seconds.     Coloration: Skin is not jaundiced or pale.     Findings: No rash.  Neurological:     Mental Status: She is alert.     Motor: No abnormal muscle tone.     Coordination: Coordination normal.     Gait: Gait normal.  Psychiatric:        Mood and Affect: Mood normal.        Speech: Speech normal.        Behavior: Behavior normal.      Depression Screen PHQ 2/9 Scores 05/20/2018 05/14/2017 05/07/2016  PHQ - 2 Score 0 6 0  PHQ- 9 Score - 15 0     Office Visit from 05/20/2018 in [facility]  AUDIT-C Score  0     GAD 7 : Generalized Anxiety Score 05/20/2018  Nervous, Anxious, on Edge 0  Control/stop worrying 0  Worry too much - different things 0  Trouble relaxing 1  Restless 0  Easily annoyed or irritable 0  Afraid - awful might happen 0  Total GAD 7 Score 1  Anxiety Difficulty Not difficult at all        Assessment & Plan:     Routine Health Maintenance and Physical Exam  Discussed health benefits of physical activity, and encouraged her to engage in regular exercise appropriate for her age and condition.    There is no immunization history on file for this patient.  Health Maintenance  Topic Date Due  . MAMMOGRAM  05/10/2018  . PAP SMEAR-Modifier  04/12/2018  . INFLUENZA VACCINE  09/04/2018  . TETANUS/TDAP  10/15/2019   . COLONOSCOPY  12/11/2022  . Hepatitis C Screening  Completed  . HIV Screening  Completed   Mammogram arranged annually through [facility] says due for PAP but pt thinks she just did it - will research since patient is new to me and have her come another time (due to [place]) if she is due for it.  Meds refilled Anxiety/depression - GAD and PHQ9 reviewed, sx well managed HLD-  Tolerating meds, compliant, ASCVD calculator and risks, diet and meds reviewed with pt.   Her labs were completed prior to visit and were also reviewed with her today, continue lifestyle and meds, f/up 1 year       Delsa Grana, PA-C 05/20/18 2:55 PM [facility]

## 2018-05-20 NOTE — Patient Instructions (Signed)
Health Maintenance, Female Adopting a healthy lifestyle and getting preventive care can go a long way to promote health and wellness. Talk with your health care provider about what schedule of regular examinations is right for you. This is a good chance for you to check in with your provider about disease prevention and staying healthy. In between checkups, there are plenty of things you can do on your own. Experts have done a lot of research about which lifestyle changes and preventive measures are most likely to keep you healthy. Ask your health care provider for more information. Weight and diet Eat a healthy diet  Be sure to include plenty of vegetables, fruits, low-fat dairy products, and lean protein.  Do not eat a lot of foods high in solid fats, added sugars, or salt.  Get regular exercise. This is one of the most important things you can do for your health. ? Most adults should exercise for at least 150 minutes each week. The exercise should increase your heart rate and make you sweat (moderate-intensity exercise). ? Most adults should also do strengthening exercises at least twice a week. This is in addition to the moderate-intensity exercise. Maintain a healthy weight  Body mass index (BMI) is a measurement that can be used to identify possible weight problems. It estimates body fat based on height and weight. Your health care provider can help determine your BMI and help you achieve or maintain a healthy weight.  For females 5 years of age and older: ? A BMI below 18.5 is considered underweight. ? A BMI of 18.5 to 24.9 is normal. ? A BMI of 25 to 29.9 is considered overweight. ? A BMI of 30 and above is considered obese. Watch levels of cholesterol and blood lipids  You should start having your blood tested for lipids and cholesterol at 56 years of age, then have this test every 5 years.  You may need to have your cholesterol levels checked more often if: ? Your lipid or  cholesterol levels are high. ? You are older than 56 years of age. ? You are at high risk for heart disease. Cancer screening Lung Cancer  Lung cancer screening is recommended for adults 48-79 years old who are at high risk for lung cancer because of a history of smoking.  A yearly low-dose CT scan of the lungs is recommended for people who: ? Currently smoke. ? Have quit within the past 15 years. ? Have at least a 30-pack-year history of smoking. A pack year is smoking an average of one pack of cigarettes a day for 1 year.  Yearly screening should continue until it has been 15 years since you quit.  Yearly screening should stop if you develop a health problem that would prevent you from having lung cancer treatment. Breast Cancer  Practice breast self-awareness. This means understanding how your breasts normally appear and feel.  It also means doing regular breast self-exams. Let your health care provider know about any changes, no matter how small.  If you are in your 20s or 30s, you should have a clinical breast exam (CBE) by a health care provider every 1-3 years as part of a regular health exam.  If you are 22 or older, have a CBE every year. Also consider having a breast X-ray (mammogram) every year.  If you have a family history of breast cancer, talk to your health care provider about genetic screening.  If you are at high risk for breast cancer, talk  to your health care provider about having an MRI and a mammogram every year.  Breast cancer gene (BRCA) assessment is recommended for women who have family members with BRCA-related cancers. BRCA-related cancers include: ? Breast. ? Ovarian. ? Tubal. ? Peritoneal cancers.  Results of the assessment will determine the need for genetic counseling and BRCA1 and BRCA2 testing. Cervical Cancer Your health care provider may recommend that you be screened regularly for cancer of the pelvic organs (ovaries, uterus, and vagina).  This screening involves a pelvic examination, including checking for microscopic changes to the surface of your cervix (Pap test). You may be encouraged to have this screening done every 3 years, beginning at age 21.  For women ages 30-65, health care providers may recommend pelvic exams and Pap testing every 3 years, or they may recommend the Pap and pelvic exam, combined with testing for human papilloma virus (HPV), every 5 years. Some types of HPV increase your risk of cervical cancer. Testing for HPV may also be done on women of any age with unclear Pap test results.  Other health care providers may not recommend any screening for nonpregnant women who are considered low risk for pelvic cancer and who do not have symptoms. Ask your health care provider if a screening pelvic exam is right for you.  If you have had past treatment for cervical cancer or a condition that could lead to cancer, you need Pap tests and screening for cancer for at least 20 years after your treatment. If Pap tests have been discontinued, your risk factors (such as having a new sexual partner) need to be reassessed to determine if screening should resume. Some women have medical problems that increase the chance of getting cervical cancer. In these cases, your health care provider may recommend more frequent screening and Pap tests. Colorectal Cancer  This type of cancer can be detected and often prevented.  Routine colorectal cancer screening usually begins at 56 years of age and continues through 56 years of age.  Your health care provider may recommend screening at an earlier age if you have risk factors for colon cancer.  Your health care provider may also recommend using home test kits to check for hidden blood in the stool.  A small camera at the end of a tube can be used to examine your colon directly (sigmoidoscopy or colonoscopy). This is done to check for the earliest forms of colorectal cancer.  Routine  screening usually begins at age 50.  Direct examination of the colon should be repeated every 5-10 years through 56 years of age. However, you may need to be screened more often if early forms of precancerous polyps or small growths are found. Skin Cancer  Check your skin from head to toe regularly.  Tell your health care provider about any new moles or changes in moles, especially if there is a change in a mole's shape or color.  Also tell your health care provider if you have a mole that is larger than the size of a pencil eraser.  Always use sunscreen. Apply sunscreen liberally and repeatedly throughout the day.  Protect yourself by wearing long sleeves, pants, a wide-brimmed hat, and sunglasses whenever you are outside. Heart disease, diabetes, and high blood pressure  High blood pressure causes heart disease and increases the risk of stroke. High blood pressure is more likely to develop in: ? People who have blood pressure in the high end of the normal range (130-139/85-89 mm Hg). ? People   who are overweight or obese. ? People who are African American.  If you are 84-22 years of age, have your blood pressure checked every 3-5 years. If you are 67 years of age or older, have your blood pressure checked every year. You should have your blood pressure measured twice-once when you are at a hospital or clinic, and once when you are not at a hospital or clinic. Record the average of the two measurements. To check your blood pressure when you are not at a hospital or clinic, you can use: ? An automated blood pressure machine at a pharmacy. ? A home blood pressure monitor.  If you are between 52 years and 3 years old, ask your health care provider if you should take aspirin to prevent strokes.  Have regular diabetes screenings. This involves taking a blood sample to check your fasting blood sugar level. ? If you are at a normal weight and have a low risk for diabetes, have this test once  every three years after 56 years of age. ? If you are overweight and have a high risk for diabetes, consider being tested at a younger age or more often. Preventing infection Hepatitis B  If you have a higher risk for hepatitis B, you should be screened for this virus. You are considered at high risk for hepatitis B if: ? You were born in a country where hepatitis B is common. Ask your health care provider which countries are considered high risk. ? Your parents were born in a high-risk country, and you have not been immunized against hepatitis B (hepatitis B vaccine). ? You have HIV or AIDS. ? You use needles to inject street drugs. ? You live with someone who has hepatitis B. ? You have had sex with someone who has hepatitis B. ? You get hemodialysis treatment. ? You take certain medicines for conditions, including cancer, organ transplantation, and autoimmune conditions. Hepatitis C  Blood testing is recommended for: ? Everyone born from [birth year] through 1963. ? Anyone with known risk factors for hepatitis C. Sexually transmitted infections (STIs)  You should be screened for sexually transmitted infections (STIs) including gonorrhea and chlamydia if: ? You are sexually active and are younger than 56 years of age. ? You are older than 55 years of age and your health care provider tells you that you are at risk for this type of infection. ? Your sexual activity has changed since you were last screened and you are at an increased risk for chlamydia or gonorrhea. Ask your health care provider if you are at risk.  If you do not have HIV, but are at risk, it may be recommended that you take a prescription medicine daily to prevent HIV infection. This is called pre-exposure prophylaxis (PrEP). You are considered at risk if: ? You are sexually active and do not regularly use condoms or know the HIV status of your partner(s). ? You take drugs by injection. ? You are sexually active with a partner  who has HIV. Talk with your health care provider about whether you are at high risk of being infected with HIV. If you choose to begin PrEP, you should first be tested for HIV. You should then be tested every 3 months for as long as you are taking PrEP. Pregnancy  If you are premenopausal and you may become pregnant, ask your health care provider about preconception counseling.  If you may become pregnant, take 400 to 800 micrograms (mcg) of folic acid every  day.  If you want to prevent pregnancy, talk to your health care provider about birth control (contraception). Osteoporosis and menopause  Osteoporosis is a disease in which the bones lose minerals and strength with aging. This can result in serious bone fractures. Your risk for osteoporosis can be identified using a bone density scan.  If you are 32 years of age or older, or if you are at risk for osteoporosis and fractures, ask your health care provider if you should be screened.  Ask your health care provider whether you should take a calcium or vitamin D supplement to lower your risk for osteoporosis.  Menopause may have certain physical symptoms and risks.  Hormone replacement therapy may reduce some of these symptoms and risks. Talk to your health care provider about whether hormone replacement therapy is right for you. Follow these instructions at home:  Schedule regular health, dental, and eye exams.  Stay current with your immunizations.  Do not use any tobacco products including cigarettes, chewing tobacco, or electronic cigarettes.  If you are pregnant, do not drink alcohol.  If you are breastfeeding, limit how much and how often you drink alcohol.  Limit alcohol intake to no more than 1 drink per day for nonpregnant women. One drink equals 12 ounces of beer, 5 ounces of wine, or 1 ounces of hard liquor.  Do not use street drugs.  Do not share needles.  Ask your health care provider for help if you need support  or information about quitting drugs.  Tell your health care provider if you often feel depressed.  Tell your health care provider if you have ever been abused or do not feel safe at home. This information is not intended to replace advice given to you by your health care provider. Make sure you discuss any questions you have with your health care provider. Document Released: 08/05/2010 Document Revised: 06/28/2015 Document Reviewed: 10/24/2014 Elsevier Interactive Patient Education  2019 [place] and Cholesterol Restricted Eating Plan Eating a diet that limits fat and cholesterol may help lower your risk for heart disease and other conditions. Your body needs fat and cholesterol for basic functions, but eating too much of these things can be harmful to your health. Your health care provider may order lab tests to check your blood fat (lipid) and cholesterol levels. This helps your health care provider understand your risk for certain conditions and whether you need to make diet changes. Work with your health care provider or dietitian to make an eating plan that is right for you. Your plan includes:  Limit your fat intake to ______% or less of your total calories a day.  Limit your saturated fat intake to ______% or less of your total calories a day.  Limit the amount of cholesterol in your diet to less than _________mg a day.  Eat ___________ g of fiber a day. What are tips for following this plan? General guidelines   If you are overweight, work with your health care provider to lose weight safely. Losing just 5-10% of your body weight can improve your overall health and help prevent diseases such as diabetes and heart disease.  Avoid: ? Foods with added sugar. ? Fried foods. ? Foods that contain partially hydrogenated oils, including stick margarine, some tub margarines, cookies, crackers, and other baked goods.  Limit alcohol intake to no more than 1 drink a day for  nonpregnant women and 2 drinks a day for men. One drink equals 12  oz of beer, 5 oz of wine, or 1 oz of hard liquor. Reading food labels  Check food labels for: ? Trans fats, partially hydrogenated oils, or high amounts of saturated fat. Avoid foods that contain saturated fat and trans fat. ? The amount of cholesterol in each serving. Try to eat no more than 200 mg of cholesterol each day. ? The amount of fiber in each serving. Try to eat at least 20-30 g of fiber each day.  Choose foods with healthy fats, such as: ? Monounsaturated and polyunsaturated fats. These include olive and canola oil, flaxseeds, walnuts, almonds, and seeds. ? Omega-3 fats. These are found in foods such as salmon, mackerel, sardines, tuna, flaxseed oil, and ground flaxseeds.  Choose grain products that have whole grains. Look for the word "whole" as the first word in the ingredient list. Cooking  Cook foods using methods other than frying. Baking, boiling, grilling, and broiling are some healthy options.  Eat more home-cooked food and less restaurant, buffet, and fast food.  Avoid cooking using saturated fats. ? Animal sources of saturated fats include meats, butter, and cream. ? Plant sources of saturated fats include palm oil, palm kernel oil, and coconut oil. Meal planning   At meals, imagine dividing your plate into fourths: ? Fill one-half of your plate with vegetables and green salads. ? Fill one-fourth of your plate with whole grains. ? Fill one-fourth of your plate with lean protein foods.  Eat fish that is high in omega-3 fats at least two times a week.  Eat more foods that contain fiber, such as whole grains, beans, apples, broccoli, carrots, peas, and barley. These foods help promote healthy cholesterol levels in the blood. Recommended foods Grains  Whole grains, such as whole wheat or whole grain breads, crackers, cereals, and pasta. Unsweetened oatmeal, bulgur, barley, quinoa, or brown rice.  Corn or whole wheat flour tortillas. Vegetables  Fresh or frozen vegetables (raw, steamed, roasted, or grilled). Green salads. Fruits  All fresh, canned (in natural juice), or frozen fruits. Meats and other protein foods  Ground beef (85% or leaner), grass-fed beef, or beef trimmed of fat. Skinless chicken or [place]. Ground chicken or [place]. Pork trimmed of fat. All fish and seafood. Egg whites. Dried beans, peas, or lentils. Unsalted nuts or seeds. Unsalted canned beans. Natural nut butters without added sugar and oil. Dairy  Low-fat or nonfat dairy products, such as skim or 1% milk, 2% or reduced-fat cheeses, low-fat and fat-free ricotta or cottage cheese, or plain low-fat and nonfat yogurt. Fats and oils  Tub margarine without trans fats. Light or reduced-fat mayonnaise and salad dressings. Avocado. Olive, canola, sesame, or safflower oils. The items listed above may not be a complete list of recommended foods or beverages. Contact your dietitian for more options. Foods to avoid Grains  White bread. White pasta. White rice. Cornbread. Bagels, pastries, and croissants. Crackers and snack foods that contain trans fat and hydrogenated oils. Vegetables  Vegetables cooked in cheese, cream, or butter sauce. Fried vegetables. Fruits  Canned fruit in heavy syrup. Fruit in cream or butter sauce. Fried fruit. Meats and other protein foods  Fatty cuts of meat. Ribs, chicken wings, bacon, sausage, bologna, salami, chitterlings, fatback, hot dogs, bratwurst, and packaged lunch meats. Liver and organ meats. Whole eggs and egg yolks. Chicken and [place] with skin. Fried meat. Dairy  Whole or 2% milk, cream, half-and-half, and cream cheese. Whole milk cheeses. Whole-fat or sweetened yogurt. Full-fat cheeses. Nondairy creamers and whipped toppings.  Processed cheese, cheese spreads, and cheese curds. Beverages  Alcohol. Sugar-sweetened drinks such as sodas, lemonade, and fruit drinks. Fats and  oils  Butter, stick margarine, lard, shortening, ghee, or bacon fat. Coconut, palm kernel, and palm oils. Sweets and desserts  Corn syrup, sugars, honey, and molasses. Candy. Jam and jelly. Syrup. Sweetened cereals. Cookies, pies, cakes, donuts, muffins, and ice cream. The items listed above may not be a complete list of foods and beverages to avoid. Contact your dietitian for more information. Summary  Your body needs fat and cholesterol for basic functions. However, eating too much of these things can be harmful to your health.  Work with your health care provider and dietitian to follow a diet low in fat and cholesterol. Doing this may help lower your risk for heart disease and other conditions.  Choose healthy fats, such as monounsaturated and polyunsaturated fats, and foods high in omega-3 fatty acids.  Eat fiber-rich foods, such as whole grains, beans, peas, fruits, and vegetables.  Limit or avoid alcohol, fried foods, and foods high in saturated fats, partially hydrogenated oils, and sugar. This information is not intended to replace advice given to you by your health care provider. Make sure you discuss any questions you have with your health care provider. Document Released: 01/20/2005 Document Revised: 10/07/2016 Document Reviewed: 10/07/2016 Elsevier Interactive Patient Education  2019 [employer].

## 2018-05-25 ENCOUNTER — Encounter: Payer: Self-pay | Admitting: Family Medicine

## 2018-08-16 LAB — HM MAMMOGRAPHY

## 2018-08-17 ENCOUNTER — Encounter: Payer: Self-pay | Admitting: *Deleted

## 2019-01-19 ENCOUNTER — Other Ambulatory Visit: Payer: Self-pay | Admitting: Family Medicine

## 2019-01-19 ENCOUNTER — Other Ambulatory Visit: Payer: Self-pay

## 2019-01-19 DIAGNOSIS — F419 Anxiety disorder, unspecified: Secondary | ICD-10-CM

## 2019-01-19 DIAGNOSIS — F329 Major depressive disorder, single episode, unspecified: Secondary | ICD-10-CM

## 2019-01-19 DIAGNOSIS — F32A Depression, unspecified: Secondary | ICD-10-CM

## 2019-05-24 ENCOUNTER — Other Ambulatory Visit: Payer: Self-pay

## 2019-05-24 ENCOUNTER — Other Ambulatory Visit: Payer: 59

## 2019-05-24 DIAGNOSIS — Z0000 Encounter for general adult medical examination without abnormal findings: Secondary | ICD-10-CM

## 2019-05-24 LAB — CBC WITH DIFFERENTIAL/PLATELET
Absolute Monocytes: 392 cells/uL (ref 200–950)
Basophils Absolute: 59 cells/uL (ref 0–200)
Basophils Relative: 1.2 %
Eosinophils Absolute: 279 cells/uL (ref 15–500)
Eosinophils Relative: 5.7 %
HCT: 41.9 % (ref 35.0–45.0)
Hemoglobin: 13.5 g/dL (ref 11.7–15.5)
Lymphs Abs: 2656 cells/uL (ref 850–3900)
MCH: 29.7 pg (ref 27.0–33.0)
MCHC: 32.2 g/dL (ref 32.0–36.0)
MCV: 92.1 fL (ref 80.0–100.0)
MPV: 10.7 fL (ref 7.5–12.5)
Monocytes Relative: 8 %
Neutro Abs: 1514 cells/uL (ref 1500–7800)
Neutrophils Relative %: 30.9 %
Platelets: 333 10*3/uL (ref 140–400)
RBC: 4.55 10*6/uL (ref 3.80–5.10)
RDW: 12.7 % (ref 11.0–15.0)
Total Lymphocyte: 54.2 %
WBC: 4.9 10*3/uL (ref 3.8–10.8)

## 2019-05-24 LAB — COMPREHENSIVE METABOLIC PANEL
AG Ratio: 2 (calc) (ref 1.0–2.5)
ALT: 12 U/L (ref 6–29)
AST: 14 U/L (ref 10–35)
Albumin: 4.2 g/dL (ref 3.6–5.1)
Alkaline phosphatase (APISO): 40 U/L (ref 37–153)
BUN: 9 mg/dL (ref 7–25)
CO2: 27 mmol/L (ref 20–32)
Calcium: 9.3 mg/dL (ref 8.6–10.4)
Chloride: 107 mmol/L (ref 98–110)
Creat: 0.67 mg/dL (ref 0.50–1.05)
Globulin: 2.1 g/dL (calc) (ref 1.9–3.7)
Glucose, Bld: 95 mg/dL (ref 65–99)
Potassium: 4.9 mmol/L (ref 3.5–5.3)
Sodium: 141 mmol/L (ref 135–146)
Total Bilirubin: 0.4 mg/dL (ref 0.2–1.2)
Total Protein: 6.3 g/dL (ref 6.1–8.1)

## 2019-05-24 LAB — LIPID PANEL
Cholesterol: 199 mg/dL (ref <200)
HDL: 60 mg/dL (ref >=50)
LDL Cholesterol (Calc): 122 mg/dL (calc) — ABNORMAL HIGH
Non-HDL Cholesterol (Calc): 139 mg/dL (calc) — ABNORMAL HIGH (ref <130)
Total CHOL/HDL Ratio: 3.3 (calc) (ref <5.0)
Triglycerides: 73 mg/dL (ref <150)

## 2019-05-25 NOTE — Progress Notes (Signed)
Follow up as planned labs look good.

## 2019-05-26 ENCOUNTER — Other Ambulatory Visit: Payer: Self-pay

## 2019-05-26 ENCOUNTER — Ambulatory Visit (INDEPENDENT_AMBULATORY_CARE_PROVIDER_SITE_OTHER): Payer: 59 | Admitting: Nurse Practitioner

## 2019-05-26 ENCOUNTER — Encounter: Payer: Self-pay | Admitting: Nurse Practitioner

## 2019-05-26 VITALS — BP 126/90 | HR 74 | Temp 97.9°F | Ht 64.0 in | Wt 125.0 lb

## 2019-05-26 DIAGNOSIS — Z0001 Encounter for general adult medical examination with abnormal findings: Secondary | ICD-10-CM | POA: Diagnosis not present

## 2019-05-26 DIAGNOSIS — Z1231 Encounter for screening mammogram for malignant neoplasm of breast: Secondary | ICD-10-CM | POA: Diagnosis not present

## 2019-05-26 DIAGNOSIS — E785 Hyperlipidemia, unspecified: Secondary | ICD-10-CM | POA: Diagnosis not present

## 2019-05-26 DIAGNOSIS — Z13228 Encounter for screening for other metabolic disorders: Secondary | ICD-10-CM

## 2019-05-26 DIAGNOSIS — F419 Anxiety disorder, unspecified: Secondary | ICD-10-CM

## 2019-05-26 DIAGNOSIS — G8929 Other chronic pain: Secondary | ICD-10-CM

## 2019-05-26 DIAGNOSIS — M545 Low back pain, unspecified: Secondary | ICD-10-CM

## 2019-05-26 DIAGNOSIS — J302 Other seasonal allergic rhinitis: Secondary | ICD-10-CM

## 2019-05-26 DIAGNOSIS — Z0000 Encounter for general adult medical examination without abnormal findings: Secondary | ICD-10-CM

## 2019-05-26 DIAGNOSIS — F329 Major depressive disorder, single episode, unspecified: Secondary | ICD-10-CM | POA: Insufficient documentation

## 2019-05-26 DIAGNOSIS — F32A Depression, unspecified: Secondary | ICD-10-CM

## 2019-05-26 MED ORDER — SERTRALINE HCL 100 MG PO TABS: 100.0000 mg | ORAL_TABLET | Freq: Every day | ORAL | 1 refills | Status: DC

## 2019-05-26 MED ORDER — BUPROPION HCL ER (XL) 150 MG PO TB24: ORAL_TABLET | ORAL | 3 refills | Status: AC

## 2019-05-26 MED ORDER — CYCLOBENZAPRINE HCL 10 MG PO TABS: 10.0000 mg | ORAL_TABLET | Freq: Three times a day (TID) | ORAL | 1 refills | Status: AC | PRN

## 2019-05-26 MED ORDER — FLUTICASONE PROPIONATE 50 MCG/ACT NA SUSP: 2.0000 | Freq: Every day | NASAL | 11 refills | Status: AC

## 2019-05-26 MED ORDER — BUPROPION HCL ER (XL) 300 MG PO TB24: ORAL_TABLET | ORAL | 3 refills | Status: AC

## 2019-05-26 MED ORDER — CETIRIZINE HCL 10 MG PO TABS: 10.0000 mg | ORAL_TABLET | Freq: Every day | ORAL | 11 refills | Status: DC

## 2019-05-26 MED ORDER — OMEGA-3-ACID ETHYL ESTERS 1 G PO CAPS: 1.0000 g | ORAL_CAPSULE | Freq: Every day | ORAL | 6 refills | Status: DC

## 2019-05-26 MED ORDER — SIMVASTATIN 10 MG PO TABS: 10.0000 mg | ORAL_TABLET | Freq: Every day | ORAL | 3 refills | Status: AC

## 2019-05-26 NOTE — Progress Notes (Signed)
Established Patient Office Visit  Subjective:  Patient ID: Kelly Joseph, female    DOB: 01/12/1961  Age: 59 y.o. MRN: [id number]  CC:  Chief Complaint  Patient presents with  . Annual Exam    HPI Kelly Joseph is a 59 year old female presenting for general wellness visit. She needs refills on all of her medications. PAP due she would like to follow up with apt. Mammogram due and she will schedule. She would not liek Tdap today. She has not had COVID vaccine and is considering. Cscope at age 15. BP stable. Depression/anxiety stable on current tx. Will refer for counseling as this is important part of tx. Allergies stable, chronic back pain continues and she would like fill on flexeril. Time spent discussing risk vs benefit and to use Tylenol, ibuprofen, heat , rest prior to taking Flexeril. She feels in overall good health with diet and exercise. No concerns, cp/ct, gu/gi sxs, pain other than stated, edema, or falls/injury.  Past Medical History:  Diagnosis Date  . Anxiety   . Depression   . Hyperlipidemia 03/14/2013    Past Surgical History:  Procedure Laterality Date  . BLEPHAROPLASTY  2006  . LIPOSUCTION  1999/2009  . MASTOPEXY  1989/1999  . resty    . restylane  2005    Family History  Problem Relation Age of Onset  . Cancer Mother        breast CA 2003, and brain CA  . Stroke Father 54  . Cancer Maternal Grandmother        ovarian  . Colon cancer Neg Hx   . Pancreatic cancer Neg Hx   . Stomach cancer Neg Hx     Social History   Socioeconomic History  . Marital status: Married    Spouse name: Not on file  . Number of children: Not on file  . Years of education: Not on file  . Highest education level: Not on file  Occupational History  . Not on file  Tobacco Use  . Smoking status: Former Smoker    Packs/day: 0.25    Years: 5.00    Pack years: 1.25    Quit date: 09/23/1982    Years since quitting: 36.6  . Smokeless tobacco: Never Used   Substance and Sexual Activity  . Alcohol use: Yes    Comment: socialy  . Drug use: No  . Sexual activity: Yes    Birth control/protection: Post-menopausal  Other Topics Concern  . Not on file  Social History Narrative   Cosmetologist.   Exercises at home 3 d /week   Taking care of granparent and mother ill   No children   Social Determinants of Health   Financial Resource Strain:   . Difficulty of Paying Living Expenses:   Food Insecurity:   . Worried About Charity fundraiser in the Last Year:   . Arboriculturist in the Last Year:   Transportation Needs:   . Film/video editor (Medical):   Marland Kitchen Lack of Transportation (Non-Medical):   Physical Activity:   . Days of Exercise per Week:   . Minutes of Exercise per Session:   Stress:   . Feeling of Stress :   Social Connections:   . Frequency of Communication with Friends and Family:   . Frequency of Social Gatherings with Friends and Family:   . Attends Religious Services:   . Active Member of Clubs or Organizations:   . Attends Archivist Meetings:   .  Marital Status:   Intimate Partner Violence:   . Fear of Current or Ex-Partner:   . Emotionally Abused:   Marland Kitchen Physically Abused:   . Sexually Abused:     Outpatient Medications Prior to Visit  Medication Sig Dispense Refill  . Ascorbic Acid (VITAMIN C) 1000 MG tablet Take 1,000 mg by mouth daily.    . Calcium-Magnesium-Zinc 333-133-5 MG TABS Take 1 tablet by mouth daily.    . Flaxseed, Linseed, (FLAX SEEDS PO) Take 1 tablet by mouth daily.     . mupirocin cream (BACTROBAN) 2 % Apply 1 application topically 3 (three) times daily. X 7 days 30 g 1  . potassium chloride SA (K-DUR,KLOR-CON) 20 MEQ tablet Take 1 tablet (20 mEq total) by mouth daily. 3 tablet 0  . ALPRAZolam (XANAX) 0.25 MG tablet Take 1 tablet (0.25 mg total) by mouth daily as needed. 30 tablet 2  . buPROPion (WELLBUTRIN XL) 150 MG 24 hr tablet TAKE 1 TABLET BY MOUTH DAILY**TAKE WITH BUPROPION  300MG  TO EQUAL 450MG  TOTAL. 90 tablet 3  . buPROPion (WELLBUTRIN XL) 300 MG 24 hr tablet TAKE 1 TABLET BY MOUTH DAILY**TAKE WITH BUPROPION 150MG  TO EQUAL 450MG  TOTAL. 90 tablet 3  . cetirizine (ZYRTEC) 10 MG tablet Take 10 mg by mouth daily.    . cyclobenzaprine (FLEXERIL) 10 MG tablet Take 1 tablet (10 mg total) by mouth 3 (three) times daily as needed for muscle spasms. 30 tablet 2  . fluticasone (FLONASE) 50 MCG/ACT nasal spray Place 2 sprays into both nostrils daily. 16 g 11  . omega-3 acid ethyl esters (LOVAZA) 1 G capsule Take 1 g by mouth daily.     . sertraline (ZOLOFT) 100 MG tablet TAKE 1 TABLET BY MOUTH EVERY DAY 90 tablet 1  . simvastatin (ZOCOR) 10 MG tablet Take 1 tablet (10 mg total) by mouth at bedtime. 90 tablet 3   No facility-administered medications prior to visit.    Allergies  Allergen Reactions  . Latex Hives  . Sulfa Antibiotics Hives    ROS Review of Systems  All other systems reviewed and are negative.     Objective:    Physical Exam  Constitutional: She is oriented to person, place, and time. She appears well-developed and well-nourished. She is cooperative.  HENT:  Head: Normocephalic.  Right Ear: Hearing and ear canal normal.  Left Ear: Hearing and ear canal normal.  Nose: Nose normal.  Mouth/Throat: Uvula is midline, oropharynx is clear and moist and mucous membranes are normal.  Eyes: Pupils are equal, round, and reactive to light. Conjunctivae, EOM and lids are normal. Lids are everted and swept, no foreign bodies found.  Neck: No JVD present. No tracheal tenderness present. Carotid bruit is not present. No thyromegaly present.  Cardiovascular: Normal rate, regular rhythm, S1 normal, S2 normal and normal heart sounds.  Pulmonary/Chest: Effort normal and breath sounds normal.  Abdominal: Soft. Normal aorta and bowel sounds are normal. There is no hepatosplenomegaly. There is no CVA tenderness.  Musculoskeletal:        General: Normal range of  motion.     Cervical back: Full passive range of motion without pain, normal range of motion and neck supple.  Lymphadenopathy:    She has no cervical adenopathy.  Neurological: She is alert and oriented to person, place, and time. Coordination normal.  Skin: Skin is warm, dry and intact.  Psychiatric: She has a normal mood and affect. Her speech is normal and behavior is normal. Judgment and thought  content normal. Cognition and memory are normal.  Nursing note and vitals reviewed.   BP 126/90 (BP Location: Left Arm, Patient Position: Sitting, Cuff Size: Normal)   Pulse 74   Temp 97.9 F (36.6 C) (Temporal)   Ht 5\' 4"  (1.626 m)   Wt 125 lb (56.7 kg)   SpO2 95%   BMI 21.46 kg/m  Wt Readings from Last 3 Encounters:  05/26/19 125 lb (56.7 kg)  05/20/18 121 lb 9.6 oz (55.2 kg)  05/14/17 120 lb 3.2 oz (54.5 kg)   Lab Results  Component Value Date   TSH 1.00 05/11/2017   Lab Results  Component Value Date   WBC 4.9 05/24/2019   HGB 13.5 05/24/2019   HCT 41.9 05/24/2019   MCV 92.1 05/24/2019   PLT 333 05/24/2019   Lab Results  Component Value Date   NA 141 05/24/2019   K 4.9 05/24/2019   CO2 27 05/24/2019   GLUCOSE 95 05/24/2019   BUN 9 05/24/2019   CREATININE 0.67 05/24/2019   BILITOT 0.4 05/24/2019   ALKPHOS 42 05/08/2016   AST 14 05/24/2019   ALT 12 05/24/2019   PROT 6.3 05/24/2019   ALBUMIN 4.3 05/08/2016   CALCIUM 9.3 05/24/2019   Lab Results  Component Value Date   CHOL 199 05/24/2019   Lab Results  Component Value Date   HDL 60 05/24/2019   Lab Results  Component Value Date   LDLCALC 122 (H) 05/24/2019   Lab Results  Component Value Date   TRIG 73 05/24/2019   Lab Results  Component Value Date   CHOLHDL 3.3 05/24/2019   No results found for: HGBA1C    Assessment & Plan:   Problem List Items Addressed This Visit      Respiratory   Allergic rhinitis   Relevant Medications   fluticasone (FLONASE) 50 MCG/ACT nasal spray   cetirizine  (ZYRTEC) 10 MG tablet     Other   Anxiety and depression   Relevant Medications   sertraline (ZOLOFT) 100 MG tablet   buPROPion (WELLBUTRIN XL) 150 MG 24 hr tablet   buPROPion (WELLBUTRIN XL) 300 MG 24 hr tablet   Other Relevant Orders   Ambulatory referral to Psychiatry   Back pain   Relevant Medications   cyclobenzaprine (FLEXERIL) 10 MG tablet   Hyperlipidemia   Relevant Medications   simvastatin (ZOCOR) 10 MG tablet   omega-3 acid ethyl esters (LOVAZA) 1 g capsule    Other Visit Diagnoses    Routine general medical examination at a health care facility    -  Primary   Encounter for screening mammogram for malignant neoplasm of breast       Relevant Orders   MM DIGITAL SCREENING BILATERAL   Screening for metabolic disorder          Meds ordered this encounter  Medications  . simvastatin (ZOCOR) 10 MG tablet    Sig: Take 1 tablet (10 mg total) by mouth at bedtime.    Dispense:  90 tablet    Refill:  3  . sertraline (ZOLOFT) 100 MG tablet    Sig: Take 1 tablet (100 mg total) by mouth daily.    Dispense:  90 tablet    Refill:  1  . omega-3 acid ethyl esters (LOVAZA) 1 g capsule    Sig: Take 1 capsule (1 g total) by mouth daily.    Dispense:  30 capsule    Refill:  6  . fluticasone (FLONASE) 50 MCG/ACT nasal spray  Sig: Place 2 sprays into both nostrils daily.    Dispense:  16 g    Refill:  11  . cyclobenzaprine (FLEXERIL) 10 MG tablet    Sig: Take 1 tablet (10 mg total) by mouth 3 (three) times daily as needed for muscle spasms.    Dispense:  30 tablet    Refill:  1  . cetirizine (ZYRTEC) 10 MG tablet    Sig: Take 1 tablet (10 mg total) by mouth daily.    Dispense:  30 tablet    Refill:  11  . buPROPion (WELLBUTRIN XL) 150 MG 24 hr tablet    Sig: TAKE 1 TABLET BY MOUTH DAILY**TAKE WITH BUPROPION 300MG  TO EQUAL 450MG  TOTAL.    Dispense:  90 tablet    Refill:  3  . buPROPion (WELLBUTRIN XL) 300 MG 24 hr tablet    Sig: TAKE 1 TABLET BY MOUTH DAILY**TAKE WITH  BUPROPION 150MG  TO EQUAL 450MG  TOTAL.    Dispense:  90 tablet    Refill:  3    Follow-up: Return in about 4 weeks (around 06/23/2019) for pap.    Annie Main, FNP

## 2019-05-26 NOTE — Patient Instructions (Signed)
Make appointment for mammogram and PAP Follow up as needed and in one year for general wellness examination Take flexeril only as needed after trying rest, heat, ibuprofen, tylenol.

## 2019-06-08 ENCOUNTER — Other Ambulatory Visit: Payer: 59 | Admitting: Nurse Practitioner

## 2019-08-23 ENCOUNTER — Encounter: Payer: Self-pay | Admitting: Family Medicine

## 2019-12-01 ENCOUNTER — Other Ambulatory Visit: Payer: Self-pay

## 2019-12-01 DIAGNOSIS — E785 Hyperlipidemia, unspecified: Secondary | ICD-10-CM

## 2019-12-01 MED ORDER — OMEGA-3-ACID ETHYL ESTERS 1 G PO CAPS: 1.0000 g | ORAL_CAPSULE | Freq: Every day | ORAL | 6 refills | Status: AC

## 2020-01-23 ENCOUNTER — Telehealth: Payer: Self-pay | Admitting: Family Medicine

## 2020-01-23 DIAGNOSIS — F419 Anxiety disorder, unspecified: Secondary | ICD-10-CM

## 2020-01-23 MED ORDER — SERTRALINE HCL 100 MG PO TABS: 100.0000 mg | ORAL_TABLET | Freq: Every day | ORAL | 1 refills | Status: AC

## 2020-01-23 NOTE — Telephone Encounter (Signed)
Patient left vm stating this hasn't been called in and she is completely out.  CB# [phone]

## 2020-01-23 NOTE — Telephone Encounter (Signed)
Patient requesting a refill on her sertraline hcl 100 mg [facility] [address]  CB# [phone]

## 2020-01-23 NOTE — Telephone Encounter (Signed)
Prescription sent to pharmacy.

## 2021-02-05 DIAGNOSIS — N390 Urinary tract infection, site not specified: Secondary | ICD-10-CM | POA: Diagnosis not present

## 2021-02-10 DIAGNOSIS — R300 Dysuria: Secondary | ICD-10-CM | POA: Diagnosis not present

## 2021-02-10 DIAGNOSIS — N390 Urinary tract infection, site not specified: Secondary | ICD-10-CM | POA: Diagnosis not present

## 2021-04-12 DIAGNOSIS — N390 Urinary tract infection, site not specified: Secondary | ICD-10-CM | POA: Diagnosis not present

## 2021-04-12 DIAGNOSIS — H6992 Unspecified Eustachian tube disorder, left ear: Secondary | ICD-10-CM | POA: Diagnosis not present

## 2021-04-17 DIAGNOSIS — N3281 Overactive bladder: Secondary | ICD-10-CM | POA: Diagnosis not present

## 2021-04-17 DIAGNOSIS — B9620 Unspecified Escherichia coli [E. coli] as the cause of diseases classified elsewhere: Secondary | ICD-10-CM | POA: Diagnosis not present

## 2021-04-17 DIAGNOSIS — R102 Pelvic and perineal pain: Secondary | ICD-10-CM | POA: Diagnosis not present

## 2021-04-17 DIAGNOSIS — N3000 Acute cystitis without hematuria: Secondary | ICD-10-CM | POA: Diagnosis not present

## 2021-04-17 DIAGNOSIS — N390 Urinary tract infection, site not specified: Secondary | ICD-10-CM | POA: Diagnosis not present

## 2021-04-30 DIAGNOSIS — N3000 Acute cystitis without hematuria: Secondary | ICD-10-CM | POA: Diagnosis not present

## 2021-04-30 DIAGNOSIS — R102 Pelvic and perineal pain: Secondary | ICD-10-CM | POA: Diagnosis not present

## 2021-04-30 DIAGNOSIS — N3281 Overactive bladder: Secondary | ICD-10-CM | POA: Diagnosis not present

## 2021-05-13 DIAGNOSIS — N898 Other specified noninflammatory disorders of vagina: Secondary | ICD-10-CM | POA: Diagnosis not present

## 2021-05-13 DIAGNOSIS — Z6822 Body mass index (BMI) 22.0-22.9, adult: Secondary | ICD-10-CM | POA: Diagnosis not present

## 2021-05-13 DIAGNOSIS — N390 Urinary tract infection, site not specified: Secondary | ICD-10-CM | POA: Diagnosis not present

## 2021-05-20 DIAGNOSIS — T781XXD Other adverse food reactions, not elsewhere classified, subsequent encounter: Secondary | ICD-10-CM | POA: Diagnosis not present

## 2021-05-20 DIAGNOSIS — T783XXD Angioneurotic edema, subsequent encounter: Secondary | ICD-10-CM | POA: Diagnosis not present

## 2021-05-20 DIAGNOSIS — J301 Allergic rhinitis due to pollen: Secondary | ICD-10-CM | POA: Diagnosis not present

## 2021-05-20 DIAGNOSIS — J3089 Other allergic rhinitis: Secondary | ICD-10-CM | POA: Diagnosis not present

## 2021-07-03 DIAGNOSIS — Z1159 Encounter for screening for other viral diseases: Secondary | ICD-10-CM | POA: Diagnosis not present

## 2021-07-03 DIAGNOSIS — Z0000 Encounter for general adult medical examination without abnormal findings: Secondary | ICD-10-CM | POA: Diagnosis not present

## 2021-07-03 DIAGNOSIS — J3089 Other allergic rhinitis: Secondary | ICD-10-CM | POA: Diagnosis not present

## 2021-07-03 DIAGNOSIS — F411 Generalized anxiety disorder: Secondary | ICD-10-CM | POA: Diagnosis not present

## 2021-07-03 DIAGNOSIS — E7800 Pure hypercholesterolemia, unspecified: Secondary | ICD-10-CM | POA: Diagnosis not present

## 2021-07-03 DIAGNOSIS — M62830 Muscle spasm of back: Secondary | ICD-10-CM | POA: Diagnosis not present

## 2021-07-30 DIAGNOSIS — R351 Nocturia: Secondary | ICD-10-CM | POA: Diagnosis not present

## 2021-07-30 DIAGNOSIS — N3281 Overactive bladder: Secondary | ICD-10-CM | POA: Diagnosis not present

## 2021-08-16 DIAGNOSIS — B9689 Other specified bacterial agents as the cause of diseases classified elsewhere: Secondary | ICD-10-CM | POA: Diagnosis not present

## 2021-08-16 DIAGNOSIS — N390 Urinary tract infection, site not specified: Secondary | ICD-10-CM | POA: Diagnosis not present

## 2021-08-22 DIAGNOSIS — N390 Urinary tract infection, site not specified: Secondary | ICD-10-CM | POA: Diagnosis not present

## 2021-08-22 DIAGNOSIS — B9689 Other specified bacterial agents as the cause of diseases classified elsewhere: Secondary | ICD-10-CM | POA: Diagnosis not present

## 2021-09-12 DIAGNOSIS — Z1231 Encounter for screening mammogram for malignant neoplasm of breast: Secondary | ICD-10-CM | POA: Diagnosis not present

## 2021-09-17 DIAGNOSIS — Z01419 Encounter for gynecological examination (general) (routine) without abnormal findings: Secondary | ICD-10-CM | POA: Diagnosis not present

## 2022-01-29 DIAGNOSIS — U071 COVID-19: Secondary | ICD-10-CM | POA: Diagnosis not present

## 2022-02-18 DIAGNOSIS — R351 Nocturia: Secondary | ICD-10-CM | POA: Diagnosis not present

## 2022-02-18 DIAGNOSIS — N3281 Overactive bladder: Secondary | ICD-10-CM | POA: Diagnosis not present

## 2022-04-28 ENCOUNTER — Emergency Department
Admission: EM | Admit: 2022-04-28 | Discharge: 2022-04-28 | Disposition: A | Discharge status: Home / Self Care | Payer: BC Managed Care – PPO | Attending: Emergency Medicine | Admitting: Emergency Medicine

## 2022-04-28 ENCOUNTER — Telehealth: Payer: Self-pay | Admitting: Emergency Medicine

## 2022-04-28 ENCOUNTER — Encounter: Payer: Self-pay | Admitting: Emergency Medicine

## 2022-04-28 ENCOUNTER — Emergency Department: Payer: BC Managed Care – PPO

## 2022-04-28 DIAGNOSIS — R7981 Abnormal blood-gas level: Secondary | ICD-10-CM | POA: Diagnosis not present

## 2022-04-28 DIAGNOSIS — R112 Nausea with vomiting, unspecified: Secondary | ICD-10-CM | POA: Insufficient documentation

## 2022-04-28 DIAGNOSIS — R059 Cough, unspecified: Secondary | ICD-10-CM | POA: Diagnosis not present

## 2022-04-28 DIAGNOSIS — R9431 Abnormal electrocardiogram [ECG] [EKG]: Secondary | ICD-10-CM | POA: Diagnosis not present

## 2022-04-28 DIAGNOSIS — R1110 Vomiting, unspecified: Secondary | ICD-10-CM | POA: Diagnosis not present

## 2022-04-28 DIAGNOSIS — Z20822 Contact with and (suspected) exposure to covid-19: Secondary | ICD-10-CM | POA: Diagnosis not present

## 2022-04-28 DIAGNOSIS — N3000 Acute cystitis without hematuria: Secondary | ICD-10-CM | POA: Insufficient documentation

## 2022-04-28 LAB — URINALYSIS, ROUTINE W REFLEX MICROSCOPIC
Bilirubin Urine: NEGATIVE
Glucose, UA: NEGATIVE mg/dL
Hgb urine dipstick: NEGATIVE
Ketones, ur: NEGATIVE mg/dL
Nitrite: NEGATIVE
Protein, ur: NEGATIVE mg/dL
Specific Gravity, Urine: 1.018 (ref 1.005–1.030)
pH: 7 (ref 5.0–8.0)

## 2022-04-28 LAB — COMPREHENSIVE METABOLIC PANEL
ALT: 32 U/L (ref 0–44)
AST: 32 U/L (ref 15–41)
Albumin: 4.4 g/dL (ref 3.5–5.0)
Alkaline Phosphatase: 48 U/L (ref 38–126)
Anion gap: 13 (ref 5–15)
BUN: 18 mg/dL (ref 6–20)
CO2: 19 mmol/L — ABNORMAL LOW (ref 22–32)
Calcium: 9.8 mg/dL (ref 8.9–10.3)
Chloride: 108 mmol/L (ref 98–111)
Creatinine, Ser: 0.65 mg/dL (ref 0.44–1.00)
GFR, Estimated: >60 mL/min (ref >60)
Glucose, Bld: 137 mg/dL — ABNORMAL HIGH (ref 70–99)
Potassium: 4 mmol/L (ref 3.5–5.1)
Sodium: 140 mmol/L (ref 135–145)
Total Bilirubin: 0.7 mg/dL (ref 0.3–1.2)
Total Protein: 7.6 g/dL (ref 6.5–8.1)

## 2022-04-28 LAB — CBC
HCT: 43.8 % (ref 36.0–46.0)
Hemoglobin: 15.2 g/dL — ABNORMAL HIGH (ref 12.0–15.0)
MCH: 29.5 pg (ref 26.0–34.0)
MCHC: 34.7 g/dL (ref 30.0–36.0)
MCV: 85 fL (ref 80.0–100.0)
Platelets: 424 10*3/uL — ABNORMAL HIGH (ref 150–400)
RBC: 5.15 MIL/uL — ABNORMAL HIGH (ref 3.87–5.11)
RDW: 13.4 % (ref 11.5–15.5)
WBC: 17.1 10*3/uL — ABNORMAL HIGH (ref 4.0–10.5)
nRBC: 0 % (ref 0.0–0.2)

## 2022-04-28 LAB — RESP PANEL BY RT-PCR (RSV, FLU A&B, COVID)  RVPGX2
Influenza A by PCR: NEGATIVE
Influenza B by PCR: NEGATIVE
Resp Syncytial Virus by PCR: NEGATIVE
SARS Coronavirus 2 by RT PCR: NEGATIVE

## 2022-04-28 LAB — TROPONIN I (HIGH SENSITIVITY): Troponin I (High Sensitivity): 3 ng/L (ref <18)

## 2022-04-28 LAB — LIPASE, BLOOD: Lipase: 36 U/L (ref 11–51)

## 2022-04-28 MED ORDER — SODIUM CHLORIDE 0.9 % IV SOLN
12.5000 mg | Freq: Four times a day (QID) | INTRAVENOUS | Status: DC | PRN
  Administered 2022-04-28: 12.5 mg via INTRAVENOUS
  Filled 2022-04-28: qty 12.5

## 2022-04-28 MED ORDER — ONDANSETRON 4 MG PO TBDP: 4.0000 mg | ORAL_TABLET | Freq: Three times a day (TID) | ORAL | 0 refills | Status: AC | PRN

## 2022-04-28 MED ORDER — CEFDINIR 300 MG PO CAPS: 300.0000 mg | ORAL_CAPSULE | Freq: Two times a day (BID) | ORAL | 0 refills | Status: AC

## 2022-04-28 MED ORDER — SODIUM CHLORIDE 0.9 % IV BOLUS
1000.0000 mL | Freq: Once | INTRAVENOUS | Status: AC
  Administered 2022-04-28: 1000 mL via INTRAVENOUS

## 2022-04-28 MED ORDER — ONDANSETRON HCL 4 MG/2ML IJ SOLN
4.0000 mg | Freq: Once | INTRAMUSCULAR | Status: AC
  Administered 2022-04-28: 4 mg via INTRAVENOUS
  Filled 2022-04-28: qty 2

## 2022-04-28 MED ORDER — PROMETHAZINE HCL 25 MG RE SUPP: 25.0000 mg | Freq: Four times a day (QID) | RECTAL | 0 refills | Status: AC | PRN

## 2022-04-28 MED ORDER — LACTATED RINGERS IV BOLUS
1000.0000 mL | Freq: Once | INTRAVENOUS | Status: AC
  Administered 2022-04-28: 1000 mL via INTRAVENOUS

## 2022-04-28 NOTE — ED Notes (Signed)
Pt passed PO challenge without incident

## 2022-04-28 NOTE — Discharge Instructions (Addendum)
We discussed the possibility this being a viral illness versus from a potential UTI.  We prescribed some Zofran try to stay well-hydrated with electrolyte drinks such as Gatorade 0.  Take the antibiotics in case this could be a UTI.  We discussed CT imaging of your abdomen but given you have no tenderness we have opted to hold off but if you develop any pain in your abdomen that is not going away then please return to the ER for repeat evaluation fevers or any other concerns

## 2022-04-28 NOTE — ED Notes (Signed)
Pt given ginger ale and saltines for PO challenge. Will reassess after pt eats/drinks.

## 2022-04-28 NOTE — ED Triage Notes (Signed)
Pt presents ambulatory to triage via POV with complaints of emesis that started around midnight. Pt recently getting over a virus she had last week- tested negative for COVID at home. She notes having 4 episodes of emesis which has been all she has eaten.  A&Ox4 at this time. Denies abdominal pain, CP or SOB.

## 2022-04-28 NOTE — Telephone Encounter (Signed)
Patient states she has vomiting with prescribed medications and requesting rectal Phenergan.  Prescription has been sent to new pharmacy as told [facility] staff [facility] [address] in [place].

## 2022-04-28 NOTE — ED Notes (Signed)
Patient transported to X-ray 

## 2022-04-28 NOTE — ED Provider Notes (Signed)
[facility] Provider Note    Event Date/Time   First MD Initiated Contact with Patient 04/28/22 [phone]     (approximate)   History   Emesis   HPI  Kelly Joseph is a 62 y.o. female who is otherwise healthy who comes in with concern for vomiting.  Patient reports about a week of  chest congestion that seems to be improving but then last night around midnight she started developing vomiting multiple times everything that she ate.  She does report eating some leftovers but her husband did not get sick as well.  She also does report being to a baby shower yesterday but again denies eating a ton there and denies anyone else being sick.  She denies any abdominal pain or history of abdominal surgeries.  She denies any headaches, hitting her head.  She denies having any diarrhea yet but states that she feels that she is about to start having diarrhea.  She reports having a GI bug previously which she had to the hospital and get IV fluids.    Physical Exam   Triage Vital Signs: ED Triage Vitals [04/28/22 0656]  Enc Vitals Group     BP (!) 141/93     Pulse Rate 96     Resp 18     Temp 97.7 F (36.5 C)     Temp Source Oral     SpO2 100 %     Weight 128 lb 4.9 oz (58.2 kg)     Height 5\' 4"  (1.626 m)     Head Circumference      Peak Flow      Pain Score 0     Pain Loc      Pain Edu?      Excl. in [place]?     Most recent vital signs: Vitals:   04/28/22 0656  BP: (!) 141/93  Pulse: 96  Resp: 18  Temp: 97.7 F (36.5 C)  SpO2: 100%     General: Awake, no distress.  CV:  Good peripheral perfusion.  Resp:  Normal effort.  Abd:  No distention.  Soft and nontender Other:     ED Results / Procedures / Treatments   Labs (all labs ordered are listed, but only abnormal results are displayed) Labs Reviewed  COMPREHENSIVE METABOLIC PANEL - Abnormal; Notable for the following components:      Result Value   CO2 19 (*)    Glucose, Bld 137 (*)    All  other components within normal limits  CBC - Abnormal; Notable for the following components:   WBC 17.1 (*)    RBC 5.15 (*)    Hemoglobin 15.2 (*)    Platelets 424 (*)    All other components within normal limits  URINALYSIS, ROUTINE W REFLEX MICROSCOPIC - Abnormal; Notable for the following components:   Color, Urine YELLOW (*)    APPearance HAZY (*)    Leukocytes,Ua LARGE (*)    Bacteria, UA RARE (*)    All other components within normal limits  RESP PANEL BY RT-PCR (RSV, FLU A&B, COVID)  RVPGX2  LIPASE, BLOOD  TROPONIN I (HIGH SENSITIVITY)     EKG  My interpretation of EKG:  Sinus rate of 59 without any ST elevation or T wave inversions, normal intervals  RADIOLOGY I have reviewed the xray personally and interpreted and no pneumonia   PROCEDURES:  Critical Care performed: No  .1-3 Lead EKG Interpretation  Performed by: Vanessa [place], MD Authorized  by: Vanessa [place], MD     Interpretation: normal     ECG rate:  80   ECG rate assessment: normal     Rhythm: sinus rhythm     Ectopy: none     Conduction: normal      MEDICATIONS ORDERED IN ED: Medications  promethazine (PHENERGAN) 12.5 mg in sodium chloride 0.9 % 50 mL IVPB (has no administration in time range)  sodium chloride 0.9 % bolus 1,000 mL (0 mLs Intravenous Stopped 04/28/22 0927)  ondansetron (ZOFRAN) injection 4 mg (4 mg Intravenous Given 04/28/22 0735)  lactated ringers bolus 1,000 mL (1,000 mLs Intravenous New Bag/Given 04/28/22 0940)     IMPRESSION / MDM / ASSESSMENT AND PLAN / ED COURSE  I reviewed the triage vital signs and the nursing notes.   Patient's presentation is most consistent with acute presentation with potential threat to life or bodily function.   Differential is most likely viral bug.  X-ray ordered evaluate for any pneumonia.  Abdomen is soft nontender low suspicion for acute abdominal process.  Will test for COVID, flu.  Lipase is normal urine with concerns for possible UTI will  send for culture, COVID, flu were negative.  CMP shows slightly low bicarb related to the vomiting.  White count is elevated.  9:26 AM repeat evaluation abdomen is soft and nontender.  Patient still having some nausea.  Will give some Phenergan and additional fluid  12:07 PM repeat abdominal exam remains soft and nontender.  Patient is tolerating p.o.  She reports feeling much better she feels comfortable with discharge home.  Urine looks concerning for UTI with large leuks and 21-50 WBCs will send for urine culture.  We discussed whether the nausea and vomiting could be related to the UTI versus a viral bug.  She does report history of UTIs in the past.  It we have opted to start an antibiotic given the elevated white count.  However she otherwise does not meet sepsis criteria.  We discussed CT imaging of opted to hold off given no abdominal tenderness but we did discuss return precautions in regards to this.  She has got no CVA tenderness to suggest Pyelo.  Will start her on cefdinir for 7 days and she can return to the ER if she develops worsening symptoms or any other concerns  The patient is on the cardiac monitor to evaluate for evidence of arrhythmia and/or significant heart rate changes.      FINAL CLINICAL IMPRESSION(S) / ED DIAGNOSES   Final diagnoses:  Nausea and vomiting, unspecified vomiting type  Acute cystitis without hematuria     Rx / DC Orders   ED Discharge Orders          Ordered    ondansetron (ZOFRAN-ODT) 4 MG disintegrating tablet  Every 8 hours PRN        04/28/22 1210    cefdinir (OMNICEF) 300 MG capsule  2 times daily        04/28/22 1210             Note:  This document was prepared using Dragon voice recognition software and may include unintentional dictation errors.   Vanessa , MD 04/28/22 [phone]

## 2022-04-29 LAB — URINE CULTURE

## 2022-05-09 DIAGNOSIS — K140 Glossitis: Secondary | ICD-10-CM | POA: Diagnosis not present

## 2022-07-15 DIAGNOSIS — Z0000 Encounter for general adult medical examination without abnormal findings: Secondary | ICD-10-CM | POA: Diagnosis not present

## 2022-07-15 DIAGNOSIS — F411 Generalized anxiety disorder: Secondary | ICD-10-CM | POA: Diagnosis not present

## 2022-07-15 DIAGNOSIS — E7800 Pure hypercholesterolemia, unspecified: Secondary | ICD-10-CM | POA: Diagnosis not present

## 2022-07-25 ENCOUNTER — Encounter: Payer: Self-pay | Admitting: Internal Medicine

## 2022-08-11 DIAGNOSIS — N3020 Other chronic cystitis without hematuria: Secondary | ICD-10-CM | POA: Diagnosis not present

## 2022-08-11 DIAGNOSIS — R102 Pelvic and perineal pain: Secondary | ICD-10-CM | POA: Diagnosis not present

## 2022-08-11 DIAGNOSIS — N3281 Overactive bladder: Secondary | ICD-10-CM | POA: Diagnosis not present

## 2022-08-19 DIAGNOSIS — R300 Dysuria: Secondary | ICD-10-CM | POA: Diagnosis not present

## 2022-09-15 ENCOUNTER — Emergency Department (HOSPITAL_COMMUNITY)
Admission: EM | Admit: 2022-09-15 | Discharge: 2022-09-15 | Disposition: A | Discharge status: Home / Self Care | Payer: BC Managed Care – PPO | Attending: Emergency Medicine | Admitting: Emergency Medicine

## 2022-09-15 ENCOUNTER — Other Ambulatory Visit: Payer: Self-pay

## 2022-09-15 ENCOUNTER — Emergency Department (HOSPITAL_COMMUNITY): Payer: BC Managed Care – PPO

## 2022-09-15 ENCOUNTER — Encounter (HOSPITAL_COMMUNITY): Payer: Self-pay

## 2022-09-15 DIAGNOSIS — Z91040 Latex allergy status: Secondary | ICD-10-CM | POA: Diagnosis not present

## 2022-09-15 DIAGNOSIS — R000 Tachycardia, unspecified: Secondary | ICD-10-CM | POA: Diagnosis not present

## 2022-09-15 DIAGNOSIS — R29818 Other symptoms and signs involving the nervous system: Secondary | ICD-10-CM | POA: Diagnosis not present

## 2022-09-15 DIAGNOSIS — R531 Weakness: Secondary | ICD-10-CM | POA: Diagnosis not present

## 2022-09-15 DIAGNOSIS — R519 Headache, unspecified: Secondary | ICD-10-CM | POA: Insufficient documentation

## 2022-09-15 DIAGNOSIS — R251 Tremor, unspecified: Secondary | ICD-10-CM

## 2022-09-15 DIAGNOSIS — R4781 Slurred speech: Secondary | ICD-10-CM | POA: Diagnosis not present

## 2022-09-15 DIAGNOSIS — R791 Abnormal coagulation profile: Secondary | ICD-10-CM | POA: Diagnosis not present

## 2022-09-15 LAB — CBC
HCT: 41.6 % (ref 36.0–46.0)
Hemoglobin: 13.9 g/dL (ref 12.0–15.0)
MCH: 30.1 pg (ref 26.0–34.0)
MCHC: 33.4 g/dL (ref 30.0–36.0)
MCV: 90 fL (ref 80.0–100.0)
Platelets: 367 10*3/uL (ref 150–400)
RBC: 4.62 MIL/uL (ref 3.87–5.11)
RDW: 13.6 % (ref 11.5–15.5)
WBC: 7.7 10*3/uL (ref 4.0–10.5)
nRBC: 0 % (ref 0.0–0.2)

## 2022-09-15 LAB — RAPID URINE DRUG SCREEN, HOSP PERFORMED
Amphetamines: NOT DETECTED
Barbiturates: NOT DETECTED
Benzodiazepines: NOT DETECTED
Cocaine: NOT DETECTED
Opiates: NOT DETECTED
Tetrahydrocannabinol: NOT DETECTED

## 2022-09-15 LAB — COMPREHENSIVE METABOLIC PANEL
ALT: 25 U/L (ref 0–44)
AST: 23 U/L (ref 15–41)
Albumin: 4.6 g/dL (ref 3.5–5.0)
Alkaline Phosphatase: 49 U/L (ref 38–126)
Anion gap: 8 (ref 5–15)
BUN: 15 mg/dL (ref 6–20)
CO2: 25 mmol/L (ref 22–32)
Calcium: 9.3 mg/dL (ref 8.9–10.3)
Chloride: 100 mmol/L (ref 98–111)
Creatinine, Ser: 0.82 mg/dL (ref 0.44–1.00)
GFR, Estimated: >60 mL/min (ref >60)
Glucose, Bld: 107 mg/dL — ABNORMAL HIGH (ref 70–99)
Potassium: 3.7 mmol/L (ref 3.5–5.1)
Sodium: 133 mmol/L — ABNORMAL LOW (ref 135–145)
Total Bilirubin: 0.5 mg/dL (ref 0.3–1.2)
Total Protein: 7.5 g/dL (ref 6.5–8.1)

## 2022-09-15 LAB — DIFFERENTIAL
Abs Immature Granulocytes: 0.01 10*3/uL (ref 0.00–0.07)
Basophils Absolute: 0 10*3/uL (ref 0.0–0.1)
Basophils Relative: 1 %
Eosinophils Absolute: 0.1 10*3/uL (ref 0.0–0.5)
Eosinophils Relative: 1 %
Immature Granulocytes: 0 %
Lymphocytes Relative: 29 %
Lymphs Abs: 2.2 10*3/uL (ref 0.7–4.0)
Monocytes Absolute: 0.6 10*3/uL (ref 0.1–1.0)
Monocytes Relative: 7 %
Neutro Abs: 4.8 10*3/uL (ref 1.7–7.7)
Neutrophils Relative %: 62 %

## 2022-09-15 LAB — URINALYSIS, ROUTINE W REFLEX MICROSCOPIC
Bacteria, UA: NONE SEEN
Bilirubin Urine: NEGATIVE
Glucose, UA: NEGATIVE mg/dL
Hgb urine dipstick: NEGATIVE
Ketones, ur: NEGATIVE mg/dL
Nitrite: NEGATIVE
Protein, ur: NEGATIVE mg/dL
Specific Gravity, Urine: 1.026 (ref 1.005–1.030)
pH: 7 (ref 5.0–8.0)

## 2022-09-15 LAB — ETHANOL: Alcohol, Ethyl (B): <10 mg/dL (ref <10)

## 2022-09-15 LAB — PROTIME-INR
INR: 0.9 (ref 0.8–1.2)
Prothrombin Time: 12.5 seconds (ref 11.4–15.2)

## 2022-09-15 LAB — APTT: aPTT: 28 seconds (ref 24–36)

## 2022-09-15 MED ORDER — IOHEXOL 350 MG/ML SOLN
75.0000 mL | Freq: Once | INTRAVENOUS | Status: AC | PRN
  Administered 2022-09-15: 75 mL via INTRAVENOUS

## 2022-09-15 NOTE — Discharge Instructions (Signed)
You have been seen and discharged from the emergency department.  Your blood work, CT head, CTA head and neck and MRI of the brain were normal.  It is important that you call the neuro office tomorrow.  Explained that you were seen for neurosymptoms, code stroke and it was recommended by Dr. Amada Jupiter that you follow-up in the office for further evaluation.  Follow-up with your primary provider for further evaluation and further care. Take home medications as prescribed. If you have any worsening symptoms or further concerns for your health please return to an emergency department for further evaluation.

## 2022-09-15 NOTE — Progress Notes (Signed)
Elert 1823 - EDP assessing at this time  Pt reports sudden onset of slurred speech and feeling off balance at 1500. mRS 0.   Dr. Amada Jupiter paged 1825 Pt to CT 1829 Dr. Amada Jupiter on screen 1832 Pt back from CT 1845

## 2022-09-15 NOTE — Consult Note (Signed)
[facility] Telemedicine Consult   Requesting Provider: Horton, K Consult Participants: Patient, husband, nurses Location of the provider: [place], [place] Location of the patient: [facility]  This consult was provided via telemedicine with 2-way video and audio communication. The patient/family was informed that care would be provided in this way and agreed to receive care in this manner.    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  She states that she has never had anything like this happen before.  She was not lightheaded, no vertigo, no nausea or vomiting.  She did not feel like one side was worse than the other, but would fall to either side.  She does feel like she is improving, there open feels she is not completely back to her baseline at this time.    LKW: 3pm tpa given?: No, mild symptoms IR Thrombectomy? No, no LVO Modified Rankin Scale: 0-Completely asymptomatic and back to baseline post- stroke Time of teleneurologist evaluation: 1832  Exam: Vitals:   09/15/22 1821  BP: (!) 171/107  Pulse: (!) 102  Resp: 18  Temp: 98.3 F (36.8 C)  SpO2: 99%    General: in bed, nad  1A: Level of Consciousness - 0 1B: Ask Month and Age - 0 1C: 'Blink Eyes' & 'Squeeze Hands' - 0 2: Test Horizontal Extraocular Movements - 0 3: Test Visual Fields - 0 4: Test Facial Palsy - 0 5A: Test Left Arm Motor Drift - 0 5B: Test Right Arm Motor Drift - 0 6A: Test Left Leg Motor Drift - 0 6B: Test Right Leg Motor Drift - 0 7: Test Limb Ataxia - 0 8: Test Sensation - 0 9: Test Language/Aphasia- 0 10: Test Dysarthria - 0 11: Test Extinction/Inattention - 0 NIHSS score: 0  Initially she had some tremor without past-pointing on finger-nose-finger bilaterally, however when performed with subsequent distracting maneuver, this tremor was not  apparent.  Imaging Reviewed: CT/CTA - negative  Labs reviewed in epic and pertinent values follow: Results for orders placed or performed during the hospital encounter of 09/15/22 (from the past 24 hour(s))  Ethanol     Status: None   Collection Time: 09/15/22  5:20 PM  Result Value Ref Range   Alcohol, Ethyl (B) <10 <10 mg/dL  Protime-INR     Status: None   Collection Time: 09/15/22  5:20 PM  Result Value Ref Range   Prothrombin Time 12.5 11.4 - 15.2 seconds   INR 0.9 0.8 - 1.2  APTT     Status: None   Collection Time: 09/15/22  5:20 PM  Result Value Ref Range   aPTT 28 24 - 36 seconds  CBC     Status: None   Collection Time: 09/15/22  5:20 PM  Result Value Ref Range   WBC 7.7 4.0 - 10.5 K/uL   RBC 4.62 3.87 - 5.11 MIL/uL   Hemoglobin 13.9 12.0 - 15.0 g/dL   HCT 96.2 95.2 - 84.1 %   MCV 90.0 80.0 - 100.0 fL   MCH 30.1 26.0 - 34.0 pg   MCHC 33.4 30.0 - 36.0 g/dL   RDW 32.4 40.1 - 02.7 %   Platelets 367 150 - 400 K/uL   nRBC 0.0 0.0 - 0.2 %  Differential     Status: None   Collection Time: 09/15/22  5:20 PM  Result Value Ref Range   Neutrophils  Relative % 62 %   Neutro Abs 4.8 1.7 - 7.7 K/uL   Lymphocytes Relative 29 %   Lymphs Abs 2.2 0.7 - 4.0 K/uL   Monocytes Relative 7 %   Monocytes Absolute 0.6 0.1 - 1.0 K/uL   Eosinophils Relative 1 %   Eosinophils Absolute 0.1 0.0 - 0.5 K/uL   Basophils Relative 1 %   Basophils Absolute 0.0 0.0 - 0.1 K/uL   Immature Granulocytes 0 %   Abs Immature Granulocytes 0.01 0.00 - 0.07 K/uL  Comprehensive metabolic panel     Status: Abnormal   Collection Time: 09/15/22  5:20 PM  Result Value Ref Range   Sodium 133 (L) 135 - 145 mmol/L   Potassium 3.7 3.5 - 5.1 mmol/L   Chloride 100 98 - 111 mmol/L   CO2 25 22 - 32 mmol/L   Glucose, Bld 107 (H) 70 - 99 mg/dL   BUN 15 6 - 20 mg/dL   Creatinine, Ser 4.09 0.44 - 1.00 mg/dL   Calcium 9.3 8.9 - 81.1 mg/dL   Total Protein 7.5 6.5 - 8.1 g/dL   Albumin 4.6 3.5 - 5.0 g/dL   AST 23 15 -  41 U/L   ALT 25 0 - 44 U/L   Alkaline Phosphatase 49 38 - 126 U/L   Total Bilirubin 0.5 0.3 - 1.2 mg/dL   GFR, Estimated >91 >47 mL/min   Anion gap 8 5 - 15  Urinalysis, Routine w reflex microscopic -Urine, Clean Catch     Status: Abnormal   Collection Time: 09/15/22  7:30 PM  Result Value Ref Range   Color, Urine YELLOW YELLOW   APPearance HAZY (A) CLEAR   Specific Gravity, Urine 1.026 1.005 - 1.030   pH 7.0 5.0 - 8.0   Glucose, UA NEGATIVE NEGATIVE mg/dL   Hgb urine dipstick NEGATIVE NEGATIVE   Bilirubin Urine NEGATIVE NEGATIVE   Ketones, ur NEGATIVE NEGATIVE mg/dL   Protein, ur NEGATIVE NEGATIVE mg/dL   Nitrite NEGATIVE NEGATIVE   Leukocytes,Ua MODERATE (A) NEGATIVE   RBC / HPF 0-5 0 - 5 RBC/hpf   WBC, UA 6-10 0 - 5 WBC/hpf   Bacteria, UA NONE SEEN NONE SEEN   Squamous Epithelial / HPF 6-10 0 - 5 /HPF      Assessment: 62 year old female with abrupt onset bilateral tremulousness, unsteadiness, dysarthria.  The bilateral common generalized nature of the symptoms could argue against a focal etiology, I think making cerebral ischemia considerably less likely.  I do think it would be reasonable to rule this out with MRI brain, but if this is negative then I would not favor treating this as cerebral ischemia.  Orthostasis can sometimes present symptoms such as this, and I do recommend checking orthostatic vital signs.  With no focal findings on exam, I would not recommend IV tenecteplase at this time.  Recommendations:  1) orthostatic vital signs 2) MRI brain 3) if MRI is negative, do not feel I would pursue a stroke workup at this time and she could follow-up with outpatient neurology.    Ritta Slot, MD [facility] [phone]  If 7pm- 7am, please page neurology on call as listed in AMION.

## 2022-09-15 NOTE — ED Triage Notes (Signed)
Pt reports  Slurred speech Started at 3pm Leaning to the left side Started at 3pm

## 2022-09-15 NOTE — ED Provider Notes (Signed)
[place] [facility] AT [facility] Provider Note   CSN: [id number] Arrival date & time: 09/15/22  1809     History  Chief Complaint  Patient presents with  . Aphasia  . Extremity Weakness    Left side    Kelly Joseph is a 62 y.o. female.  HPI   62 year old female presents emergency department with concern for whole body trembling, slurred speech, discoordination in her upper extremities and difficulty walking/dizziness.  This acutely happened at 3 PM.  I was called to bedside to evaluate.  Patient seen as a code stroke.  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  She then had a difficult time controlling her hands and felt off balance.  At 5 PM she called her husband who arrived at her work.  He stated that she was having slurred/started speech and was unable to walk in a straight line.  He felt like she was leaning more towards the left side.  Patient states she is never had symptoms like this in the past.  She endorses a headache yesterday but no head or neck pain currently.  She denies any vision changes, facial droop, focal weakness/numbness in 1 extremity.  Husband at bedside states that her speech continues to be slurred/stuttered and that she at times seems confused.  Home Medications Prior to Admission medications   Medication Sig Start Date End Date Taking? Authorizing Provider  Ascorbic Acid (VITAMIN C) 1000 MG tablet Take 1,000 mg by mouth daily.    [provider]  buPROPion (WELLBUTRIN XL) 150 MG 24 hr tablet TAKE 1 TABLET BY MOUTH DAILY**TAKE WITH BUPROPION 300MG  TO EQUAL 450MG  TOTAL. 05/26/19   Lawson Fiscal A, FNP  buPROPion (WELLBUTRIN XL) 300 MG 24 hr tablet TAKE 1 TABLET BY MOUTH DAILY**TAKE WITH BUPROPION 150MG  TO EQUAL 450MG  TOTAL. 05/26/19   Elmore Guise, FNP  Calcium-Magnesium-Zinc [phone] MG TABS Take 1 tablet by mouth  daily.    [provider]  cetirizine (ZYRTEC) 10 MG tablet Take 1 tablet (10 mg total) by mouth daily. 05/26/19   Elmore Guise, FNP  cyclobenzaprine (FLEXERIL) 10 MG tablet Take 1 tablet (10 mg total) by mouth 3 (three) times daily as needed for muscle spasms. 05/26/19   Jenne Pane, Crystal A, FNP  Flaxseed, Linseed, (FLAX SEEDS PO) Take 1 tablet by mouth daily.     [provider]  fluticasone (FLONASE) 50 MCG/ACT nasal spray Place 2 sprays into both nostrils daily. 05/26/19   Elmore Guise, FNP  mupirocin cream (BACTROBAN) 2 % Apply 1 application topically 3 (three) times daily. X 7 days 04/25/15   Allayne Butcher B, PA-C  omega-3 acid ethyl esters (LOVAZA) 1 g capsule Take 1 capsule (1 g total) by mouth daily. 12/01/19   Biggers, Velna Hatchet, MD  potassium chloride SA (K-DUR,KLOR-CON) 20 MEQ tablet Take 1 tablet (20 mEq total) by mouth daily. 06/01/13   Pisciotta, Joni Reining, PA-C  promethazine (PHENERGAN) 25 MG suppository Place 1 suppository (25 mg total) rectally every 6 (six) hours as needed for nausea or vomiting. 04/28/22   Merwyn Katos, MD  sertraline (ZOLOFT) 100 MG tablet Take 1 tablet (100 mg total) by mouth daily. 01/23/20   Salley Scarlet, MD  simvastatin (ZOCOR) 10 MG tablet Take 1 tablet (10 mg total) by mouth at bedtime. 05/26/19   Elmore Guise, FNP  Allergies    Latex and Sulfa antibiotics    Review of Systems   Review of Systems  Constitutional:  Negative for fever.  Respiratory:  Negative for shortness of breath.   Cardiovascular:  Negative for chest pain.  Gastrointestinal:  Negative for abdominal pain.  Musculoskeletal:  Negative for neck pain.  Neurological:  Positive for dizziness, tremors, speech difficulty, weakness and headaches. Negative for facial asymmetry and numbness.    Physical Exam Updated Vital Signs BP (!) 171/107 (BP Location: Right Arm)   Pulse (!) 102   Temp 98.3 F (36.8 C) (Oral)   Resp 18   Wt 56.7 kg   SpO2 99%   BMI  21.46 kg/m  Physical Exam Vitals and nursing note reviewed.  Constitutional:      General: She is not in acute distress.    Appearance: Normal appearance.     Comments: Whole body twitching that is intermittent  HENT:     Head: Normocephalic.     Mouth/Throat:     Mouth: Mucous membranes are moist.  Eyes:     Pupils: Pupils are equal, round, and reactive to light.  Cardiovascular:     Rate and Rhythm: Tachycardia present.  Pulmonary:     Effort: Pulmonary effort is normal. No respiratory distress.  Abdominal:     Palpations: Abdomen is soft.     Tenderness: There is no abdominal tenderness.  Skin:    General: Skin is warm.  Neurological:     Mental Status: She is alert and oriented to person, place, and time.     Comments: At times is slow to respond, speech at times sounds slurred and stuttered.  She has no focal weakness in any extremity, no sensory discrepancy.  Seems to have ataxia in the right upper extremity.  Face is symmetric.  No visual deficit appreciated.    ED Results / Procedures / Treatments   Labs (all labs ordered are listed, but only abnormal results are displayed) Labs Reviewed  ETHANOL  PROTIME-INR  APTT  CBC  DIFFERENTIAL  COMPREHENSIVE METABOLIC PANEL  RAPID URINE DRUG SCREEN, HOSP PERFORMED  URINALYSIS, ROUTINE W REFLEX MICROSCOPIC  I-STAT CHEM 8, ED    EKG None  Radiology CT HEAD CODE STROKE WO CONTRAST  Result Date: 09/15/2022 CLINICAL DATA:  Code stroke. Neuro deficit, acute, stroke suspected. Slurred speech. Leaning to the left side. EXAM: CT HEAD WITHOUT CONTRAST TECHNIQUE: Contiguous axial images were obtained from the base of the skull through the vertex without intravenous contrast. RADIATION DOSE REDUCTION: This exam was performed according to the departmental dose-optimization program which includes automated exposure control, adjustment of the mA and/or kV according to patient size and/or use of iterative reconstruction technique.  COMPARISON:  None Available. FINDINGS: Brain: No acute intracranial hemorrhage. Patchy hypoattenuation of the periventricular white matter, most consistent with mild chronic small-vessel disease. Cortical gray-white differentiation is preserved. No hydrocephalus or extra-axial collection. No mass effect or midline shift. Vascular: No hyperdense vessel or unexpected calcification. Skull: No calvarial fracture or suspicious bone lesion. Skull base is unremarkable. Sinuses/Orbits: No acute finding. Other: None. ASPECTS [facility] Stroke Program Early CT Score) - Ganglionic level infarction (caudate, lentiform nuclei, internal capsule, insula, M1-M3 cortex): 7 - Supraganglionic infarction (M4-M6 cortex): 3 Total score (0-10 with 10 being normal): 10 IMPRESSION: 1. No acute intracranial hemorrhage or evidence of acute large vessel territory infarct. ASPECT score is 10. 2. Mild chronic small-vessel disease. Code stroke imaging results were communicated on 09/15/2022 at 6:40 pm to  provider Dr. Selina Cooley via secure text paging. Electronically Signed   By: Orvan Falconer M.D.   On: 09/15/2022 18:40    Procedures .Critical Care  Performed by: Rozelle Logan, DO Authorized by: Rozelle Logan, DO   Critical care provider statement:    Critical care time (minutes):  45   Critical care time was exclusive of:  Separately billable procedures and treating other patients   Critical care was necessary to treat or prevent imminent or life-threatening deterioration of the following conditions:  CNS failure or compromise   Critical care was time spent personally by me on the following activities:  Development of treatment plan with patient or surrogate, discussions with consultants, evaluation of patient's response to treatment, examination of patient, ordering and review of laboratory studies, ordering and review of radiographic studies, ordering and performing treatments and interventions, pulse oximetry, re-evaluation of  patient's condition and review of old charts     Medications Ordered in ED Medications  iohexol (OMNIPAQUE) 350 MG/ML injection 75 mL (75 mLs Intravenous Contrast Given 09/15/22 1838)    ED Course/ Medical Decision Making/ A&P                                 Medical Decision Making Amount and/or Complexity of Data Reviewed Labs: ordered. Radiology: ordered.  Risk Prescription drug management.   62 year old female presents emergency department (sudden onset whole body tremors associated with slurred speech, difficulty using her hands and unsteady gait.  I was called to bedside and patient seen emergently.  Having intermittent slurred/stuttering speech as well as ataxia, mostly in bilateral lower extremities, worse on right.  Unsteady gait.  Code stroke paged.  CT head unremarkable, CTA head neck shows no acute finding.  Consulting neurologist, Dr. Amada Jupiter recommends proceeding with MRI.  If this is negative recommend outpatient follow up.  Patient was able to stand and ambulate to the bathroom.  Did not become hypotensive or orthostatic.  Blood work is normal without any acute findings, UDS is negative.  MRI of the brain is negative.  On reevaluation symptoms have completely resolved.  Questionable if this could be related to anxiety/functional.  The above results were discussed with the patient and her husband.  They will follow-up as an outpatient with neurology for further evaluation.  Patient at this time appears safe and stable for discharge and close outpatient follow up. Discharge plan and strict return to ED precautions discussed, patient verbalizes understanding and agreement.         Final Clinical Impression(s) / ED Diagnoses Final diagnoses:  None    Rx / DC Orders ED Discharge Orders     None         Rozelle Logan, DO 09/15/22 2145

## 2022-09-26 DIAGNOSIS — Z01419 Encounter for gynecological examination (general) (routine) without abnormal findings: Secondary | ICD-10-CM | POA: Diagnosis not present

## 2022-09-26 DIAGNOSIS — Z6820 Body mass index (BMI) 20.0-20.9, adult: Secondary | ICD-10-CM | POA: Diagnosis not present

## 2022-09-26 DIAGNOSIS — Z1231 Encounter for screening mammogram for malignant neoplasm of breast: Secondary | ICD-10-CM | POA: Diagnosis not present

## 2022-11-26 ENCOUNTER — Ambulatory Visit (AMBULATORY_SURGERY_CENTER): Payer: BC Managed Care – PPO

## 2022-11-26 VITALS — Ht 64.0 in | Wt 125.0 lb

## 2022-11-26 DIAGNOSIS — Z1211 Encounter for screening for malignant neoplasm of colon: Secondary | ICD-10-CM

## 2022-11-26 MED ORDER — NA SULFATE-K SULFATE-MG SULF 17.5-3.13-1.6 GM/177ML PO SOLN: 1.0000 | Freq: Once | ORAL | 0 refills | Status: AC

## 2022-11-26 NOTE — Progress Notes (Signed)

## 2022-11-27 ENCOUNTER — Encounter: Payer: Self-pay | Admitting: Diagnostic Neuroimaging

## 2022-12-05 ENCOUNTER — Ambulatory Visit: Payer: BC Managed Care – PPO | Admitting: Diagnostic Neuroimaging

## 2022-12-10 ENCOUNTER — Encounter: Payer: Self-pay | Admitting: Internal Medicine

## 2022-12-18 ENCOUNTER — Encounter: Payer: Self-pay | Admitting: Internal Medicine

## 2022-12-18 NOTE — Progress Notes (Signed)
[facility] History and Physical   Primary Care Physician:  [facility] [place]   Reason for Procedure:  Colon cancer screening  Plan:    Colonoscopy     HPI: Kelly Joseph is a 62 y.o. female presenting for repeat screening colonoscopy.  2014 exam was normal.   Past Medical History:  Diagnosis Date   Anxiety    Depression    Hyperlipidemia 03/14/2013    Past Surgical History:  Procedure Laterality Date   BLEPHAROPLASTY  02/04/2004   COLONOSCOPY  2014   LIPOSUCTION  1999/2009   MASTOPEXY  1989/1999   resty     restylane  02/04/2003    Prior to Admission medications   Medication Sig Start Date End Date Taking? Authorizing Provider  ALPRAZolam (XANAX) 0.25 MG tablet Take 0.25 mg by mouth daily as needed.    [provider]  Ascorbic Acid (VITAMIN C) 1000 MG tablet Take 1,000 mg by mouth daily.    [provider]  buPROPion (WELLBUTRIN XL) 150 MG 24 hr tablet TAKE 1 TABLET BY MOUTH DAILY**TAKE WITH BUPROPION 300MG  TO EQUAL 450MG  TOTAL. Patient taking differently: 150 mg daily. TAKE 1 TABLET BY MOUTH DAILY**TAKE WITH BUPROPION 300MG  TO EQUAL 350MG  TOTAL. 05/26/19   Lawson Fiscal A, FNP  buPROPion (WELLBUTRIN XL) 300 MG 24 hr tablet TAKE 1 TABLET BY MOUTH DAILY**TAKE WITH BUPROPION 150MG  TO EQUAL 450MG  TOTAL. Patient taking differently: Take 300 mg by mouth daily. TAKE 1 TABLET BY MOUTH DAILY**TAKE WITH BUPROPION 150MG  TO EQUAL 350MG  TOTAL. 05/26/19   Elmore Guise, FNP  Calcium-Magnesium-Zinc [phone] MG TABS Take 1 tablet by mouth daily.    [provider]  cephALEXin (KEFLEX) 500 MG capsule Take 500 mg by mouth 2 (two) times daily. Patient not taking: Reported on 09/15/2022 08/19/22   [provider]  Cholecalciferol (D3) 25 MCG (1000 UT) capsule Take 1,000 Units by mouth daily.    [provider]  Cyanocobalamin (B-12 PO) Take 1 tablet by mouth daily.    [provider]   cyclobenzaprine (FLEXERIL) 10 MG tablet Take 1 tablet (10 mg total) by mouth 3 (three) times daily as needed for muscle spasms. 05/26/19   Elmore Guise, FNP  estradiol (ESTRACE) 0.1 MG/GM vaginal cream Place 1 Applicatorful vaginally 3 (three) times a week.    [provider]  ferrous sulfate 325 (65 FE) MG EC tablet Take 325 mg by mouth 3 (three) times daily with meals.    [provider]  Fexofenadine HCl (ALLEGRA PO) Take 1 tablet by mouth daily.    [provider]  Flaxseed, Linseed, (FLAX SEEDS PO) Take 1 tablet by mouth daily.  Patient not taking: Reported on 09/15/2022    [provider]  fluticasone (FLONASE) 50 MCG/ACT nasal spray Place 2 sprays into both nostrils daily. Patient not taking: Reported on 11/26/2022 05/26/19   Elmore Guise, FNP  GARCINIA CAMBOGIA-CHROMIUM PO Take 1 tablet by mouth daily.    [provider]  Multiple Vitamin (MULTIVITAMIN WITH MINERALS) TABS tablet Take 1 tablet by mouth daily.    [provider]  omega-3 acid ethyl esters (LOVAZA) 1 g capsule Take 1 capsule (1 g total) by mouth daily. Patient not taking: Reported on 09/15/2022 12/01/19   Salley Scarlet, MD  potassium chloride SA (K-DUR,KLOR-CON) 20 MEQ tablet Take 1 tablet (20 mEq total) by mouth daily. 06/01/13   Pisciotta, Joni Reining, PA-C  promethazine (PHENERGAN) 25 MG suppository Place 1 suppository (25 mg  total) rectally every 6 (six) hours as needed for nausea or vomiting. 04/28/22   Merwyn Katos, MD  sertraline (ZOLOFT) 100 MG tablet Take 1 tablet (100 mg total) by mouth daily. 01/23/20   Salley Scarlet, MD  simvastatin (ZOCOR) 10 MG tablet Take 1 tablet (10 mg total) by mouth at bedtime. 05/26/19   Elmore Guise, FNP  Turmeric (QC TUMERIC COMPLEX PO) Take 1 tablet by mouth daily.    [provider]  vitamin E 1000 UNIT capsule Take 1,000 Units by mouth daily.    [provider]    Current Outpatient Medications   Medication Sig Dispense Refill   Ascorbic Acid (VITAMIN C) 1000 MG tablet Take 1,000 mg by mouth daily.     buPROPion (WELLBUTRIN XL) 150 MG 24 hr tablet TAKE 1 TABLET BY MOUTH DAILY**TAKE WITH BUPROPION 300MG  TO EQUAL 450MG  TOTAL. (Patient taking differently: 150 mg daily. TAKE 1 TABLET BY MOUTH DAILY**TAKE WITH BUPROPION 300MG  TO EQUAL 350MG  TOTAL.) 90 tablet 3   buPROPion (WELLBUTRIN XL) 300 MG 24 hr tablet TAKE 1 TABLET BY MOUTH DAILY**TAKE WITH BUPROPION 150MG  TO EQUAL 450MG  TOTAL. (Patient taking differently: Take 300 mg by mouth daily. TAKE 1 TABLET BY MOUTH DAILY**TAKE WITH BUPROPION 150MG  TO EQUAL 350MG  TOTAL.) 90 tablet 3   Calcium-Magnesium-Zinc [phone] MG TABS Take 1 tablet by mouth daily.     Cholecalciferol (D3) 25 MCG (1000 UT) capsule Take 1,000 Units by mouth daily.     Cyanocobalamin (B-12 PO) Take 1 tablet by mouth daily.     Fexofenadine HCl (ALLEGRA PO) Take 1 tablet by mouth daily.     fluticasone (FLONASE) 50 MCG/ACT nasal spray Place 2 sprays into both nostrils daily. 16 g 11   GARCINIA CAMBOGIA-CHROMIUM PO Take 1 tablet by mouth daily.     Multiple Vitamin (MULTIVITAMIN WITH MINERALS) TABS tablet Take 1 tablet by mouth daily.     potassium chloride SA (K-DUR,KLOR-CON) 20 MEQ tablet Take 1 tablet (20 mEq total) by mouth daily. 3 tablet 0   sertraline (ZOLOFT) 100 MG tablet Take 1 tablet (100 mg total) by mouth daily. 90 tablet 1   simvastatin (ZOCOR) 10 MG tablet Take 1 tablet (10 mg total) by mouth at bedtime. 90 tablet 3   Turmeric (QC TUMERIC COMPLEX PO) Take 1 tablet by mouth daily.     vitamin E 1000 UNIT capsule Take 1,000 Units by mouth daily.     ALPRAZolam (XANAX) 0.25 MG tablet Take 0.25 mg by mouth daily as needed.     cyclobenzaprine (FLEXERIL) 10 MG tablet Take 1 tablet (10 mg total) by mouth 3 (three) times daily as needed for muscle spasms. 30 tablet 1   estradiol (ESTRACE) 0.1 MG/GM vaginal cream Place 1 Applicatorful vaginally 3 (three) times a week.      ferrous sulfate 325 (65 FE) MG EC tablet Take 325 mg by mouth 3 (three) times daily with meals.     omega-3 acid ethyl esters (LOVAZA) 1 g capsule Take 1 capsule (1 g total) by mouth daily. (Patient not taking: Reported on 09/15/2022) 30 capsule 6   promethazine (PHENERGAN) 25 MG suppository Place 1 suppository (25 mg total) rectally every 6 (six) hours as needed for nausea or vomiting. 12 each 0   Current Facility-Administered Medications  Medication Dose Route Frequency Provider Last Rate Last Admin   0.9 %  sodium chloride infusion  500 mL Intravenous Once Iva Boop, MD        Allergies  as of 12/19/2022 - Review Complete 12/19/2022  Allergen Reaction Noted   Amoxicillin  09/15/2022   Latex Hives 09/22/2012   Nitrofurantoin  09/15/2022   Sulfa antibiotics Hives 09/22/2012    Family History  Problem Relation Age of Onset   Cancer Mother        breast CA 2003, and brain CA   Stroke Father 66   Cancer Maternal Grandmother        ovarian   Colon cancer Neg Hx    Pancreatic cancer Neg Hx    Stomach cancer Neg Hx    Rectal cancer Neg Hx    Esophageal cancer Neg Hx     Social History   Socioeconomic History   Marital status: Married    Spouse name: Not on file   Number of children: Not on file   Years of education: Not on file   Highest education level: Not on file  Occupational History   Not on file  Tobacco Use   Smoking status: Former    Current packs/day: 0.00    Average packs/day: 0.3 packs/day for 5.0 years (1.3 ttl pk-yrs)    Types: Cigarettes    Start date: 09/22/1977    Quit date: 09/23/1982    Years since quitting: 40.2   Smokeless tobacco: Never  Vaping Use   Vaping status: Never Used  Substance and Sexual Activity   Alcohol use: Yes    Comment: socialy   Drug use: No   Sexual activity: Yes    Birth control/protection: Post-menopausal  Other Topics Concern   Not on file  Social History Narrative   Cosmetologist.   Exercises at home 3 d /week    Taking care of granparent and mother ill   No children   Social Determinants of Health   Financial Resource Strain: Not on file  Food Insecurity: Not on file  Transportation Needs: Not on file  Physical Activity: Insufficiently Active (05/20/2018)   Exercise Vital Sign    Days of Exercise per Week: 3 days    Minutes of Exercise per Session: 30 min  Stress: Not on file  Social Connections: Not on file  Intimate Partner Violence: Not on file    Review of Systems:  All other review of systems negative except as mentioned in the HPI.  Physical Exam: Vital signs BP (!) 106/45   Pulse 70   Temp 97.8 F (36.6 C) (Temporal)   Ht 5\' 4"  (1.626 m)   Wt 125 lb (56.7 kg)   SpO2 97%   BMI 21.46 kg/m   General:   Alert,  Well-developed, well-nourished, pleasant and cooperative in NAD Lungs:  Clear throughout to auscultation.   Heart:  Regular rate and rhythm; no murmurs, clicks, rubs,  or gallops. Abdomen:  Soft, nontender and nondistended. Normal bowel sounds.   Neuro/Psych:  Alert and cooperative. Normal mood and affect. A and O x 3   @Larissa Pegg  Sena Slate, MD, [facility] [phone] (pager) 12/19/2022 9:11 AM@

## 2022-12-19 ENCOUNTER — Ambulatory Visit (AMBULATORY_SURGERY_CENTER): Payer: BC Managed Care – PPO | Admitting: Internal Medicine

## 2022-12-19 ENCOUNTER — Encounter: Payer: Self-pay | Admitting: Internal Medicine

## 2022-12-19 VITALS — BP 114/73 | HR 63 | Temp 97.8°F | Resp 16 | Ht 64.0 in | Wt 125.0 lb

## 2022-12-19 DIAGNOSIS — Z1211 Encounter for screening for malignant neoplasm of colon: Secondary | ICD-10-CM | POA: Diagnosis not present

## 2022-12-19 MED ORDER — SODIUM CHLORIDE 0.9 % IV SOLN: 500.0000 mL | Freq: Once | INTRAVENOUS | Status: DC

## 2022-12-19 NOTE — Patient Instructions (Addendum)
Normal colonoscopy today - no polyps or cancer seen.  I appreciate the opportunity to care for you. Iva Boop, MD, FACG    YOU HAD AN ENDOSCOPIC PROCEDURE TODAY AT THE [facility]:   Refer to the procedure report that was given to you for any specific questions about what was found during the examination.  If the procedure report does not answer your questions, please call your gastroenterologist to clarify.  If you requested that your care partner not be given the details of your procedure findings, then the procedure report has been included in a sealed envelope for you to review at your convenience later.  YOU SHOULD EXPECT: Some feelings of bloating in the abdomen. Passage of more gas than usual.  Walking can help get rid of the air that was put into your GI tract during the procedure and reduce the bloating. If you had a lower endoscopy (such as a colonoscopy or flexible sigmoidoscopy) you may notice spotting of blood in your stool or on the toilet paper. If you underwent a bowel prep for your procedure, you may not have a normal bowel movement for a few days.  Please Note:  You might notice some irritation and congestion in your nose or some drainage.  This is from the oxygen used during your procedure.  There is no need for concern and it should clear up in a day or so.  SYMPTOMS TO REPORT IMMEDIATELY:  Following lower endoscopy (colonoscopy or flexible sigmoidoscopy):  Excessive amounts of blood in the stool  Significant tenderness or worsening of abdominal pains  Swelling of the abdomen that is new, acute  Fever of 100F or higher  For urgent or emergent issues, a gastroenterologist can be reached at any hour by calling [phone]. Do not use MyChart messaging for urgent concerns.    DIET:  We do recommend a small meal at first, but then you may proceed to your regular diet.  Drink plenty of fluids but you should avoid alcoholic beverages for 24  hours.  ACTIVITY:  You should plan to take it easy for the rest of today and you should NOT DRIVE or use heavy machinery until tomorrow (because of the sedation medicines used during the test).    FOLLOW UP: Our staff will call the number listed on your records the next business day following your procedure.  We will call around 7:15- 8:00 am to check on you and address any questions or concerns that you may have regarding the information given to you following your procedure. If we do not reach you, we will leave a message.     If any biopsies were taken you will be contacted by phone or by letter within the next 1-3 weeks.  Please call us at [phone] if you have not heard about the biopsies in 3 weeks.    SIGNATURES/CONFIDENTIALITY: You and/or your care partner have signed paperwork which will be entered into your electronic medical record.  These signatures attest to the fact that that the information above on your After Visit Summary has been reviewed and is understood.  Full responsibility of the confidentiality of this discharge information lies with you and/or your care-partner.

## 2022-12-19 NOTE — Op Note (Signed)
[facility] Patient Name: Kelly Joseph Procedure Date: 12/19/2022 9:15 AM MRN: [id number] Endoscopist: Iva Boop , MD, [id number] Age: 62 Referring MD:  Date of Birth: Aug 25, 1960 Gender: Female Account #: [id number] Procedure:                Colonoscopy Indications:              Screening for colorectal malignant neoplasm, Last                            colonoscopy: 2014 Medicines:                Monitored Anesthesia Care Procedure:                Pre-Anesthesia Assessment:                           - Prior to the procedure, a History and Physical                            was performed, and patient medications and                            allergies were reviewed. The patient's tolerance of                            previous anesthesia was also reviewed. The risks                            and benefits of the procedure and the sedation                            options and risks were discussed with the patient.                            All questions were answered, and informed consent                            was obtained. Prior Anticoagulants: The patient has                            taken no anticoagulant or antiplatelet agents. ASA                            Grade Assessment: II - A patient with mild systemic                            disease. After reviewing the risks and benefits,                            the patient was deemed in satisfactory condition to                            undergo the procedure.  After obtaining informed consent, the colonoscope                            was passed under direct vision. Throughout the                            procedure, the patient's blood pressure, pulse, and                            oxygen saturations were monitored continuously. The                            Olympus Scope [id number] was introduced through the                            anus and advanced to the the  cecum, identified by                            appendiceal orifice and ileocecal valve. The                            colonoscopy was performed without difficulty. The                            patient tolerated the procedure well. The quality                            of the bowel preparation was good. The ileocecal                            valve, appendiceal orifice, and rectum were                            photographed. The bowel preparation used was SUPREP                            via split dose instruction. Scope In: 9:18:44 AM Scope Out: 9:33:37 AM Scope Withdrawal Time: 0 hours 11 minutes 24 seconds  Total Procedure Duration: 0 hours 14 minutes 53 seconds  Findings:                 The perianal and digital rectal examinations were                            normal.                           The entire examined colon appeared normal on direct                            and retroflexion views. Complications:            No immediate complications. Estimated Blood Loss:     Estimated blood loss: none. Impression:               - The entire examined  colon is normal on direct and                            retroflexion views.                           - No specimens collected. Recommendation:           - Patient has a contact number available for                            emergencies. The signs and symptoms of potential                            delayed complications were discussed with the                            patient. Return to normal activities tomorrow.                            Written discharge instructions were provided to the                            patient.                           - Resume previous diet.                           - Continue present medications.                           - Repeat colonoscopy in 10 years for screening                            purposes. Iva Boop, MD 12/19/2022 9:44:56 AM This report has been signed  electronically.

## 2022-12-19 NOTE — Progress Notes (Signed)
Pt's states no medical or surgical changes since previsit or office visit. 

## 2022-12-19 NOTE — Progress Notes (Signed)
Vss nad trans to pacu 

## 2022-12-22 ENCOUNTER — Telehealth: Payer: Self-pay

## 2022-12-22 NOTE — Telephone Encounter (Signed)
Left message on follow up call. 

## 2023-01-06 ENCOUNTER — Encounter: Payer: Self-pay | Admitting: Diagnostic Neuroimaging

## 2023-01-06 ENCOUNTER — Ambulatory Visit: Payer: BC Managed Care – PPO | Admitting: Diagnostic Neuroimaging

## 2023-01-06 VITALS — BP 118/76 | HR 72 | Ht 64.0 in | Wt 121.8 lb

## 2023-01-06 DIAGNOSIS — R251 Tremor, unspecified: Secondary | ICD-10-CM

## 2023-01-06 NOTE — Patient Instructions (Signed)
  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

## 2023-01-06 NOTE — Progress Notes (Signed)
[facility]  PATIENT: Kelly Joseph DOB: 1960/04/19  REFERRING CLINICIAN: Darrin Nipper Family M* HISTORY FROM: patient  REASON FOR VISIT: new consult   HISTORICAL  CHIEF COMPLAINT:  Chief Complaint  Patient presents with   New Patient (Initial Visit)    Patient in room #6 and alone. Patient states she having tremors, slurred speech and feeling unsteady with her balance.    HISTORY OF PRESENT ILLNESS:   62 year old female here for evaluation of transient tremors and speech difficulty.  09/15/2022 patient was at work as a Interior and spatial designer, around 3 PM noticed onset of tremors in upper extremities.  She tried to finish her work with her clients until 5 PM.  This caused increasing stress and anxiety.  She called her husband for help to take her home.  He noticed slurred speech and confusion.  He take her to the emergency room for evaluation.  Symptoms continued until about 9 PM.  She had testing, imaging studies, and was discharged home.  Since that time no recurrent symptoms.   REVIEW OF SYSTEMS: Full 14 system review of systems performed and negative with exception of: as per HPI.  ALLERGIES: Allergies  Allergen Reactions   Amoxicillin    Latex Hives   Nitrofurantoin    Sulfa Antibiotics Hives    HOME MEDICATIONS: Outpatient Medications Prior to Visit  Medication Sig Dispense Refill   ALPRAZolam (XANAX) 0.25 MG tablet Take 0.25 mg by mouth daily as needed.     Ascorbic Acid (VITAMIN C) 1000 MG tablet Take 1,000 mg by mouth daily.     buPROPion (WELLBUTRIN XL) 150 MG 24 hr tablet TAKE 1 TABLET BY MOUTH DAILY**TAKE WITH BUPROPION 300MG  TO EQUAL 450MG  TOTAL. (Patient taking differently: 150 mg daily. TAKE 1 TABLET BY MOUTH DAILY**TAKE WITH BUPROPION 300MG  TO EQUAL 350MG  TOTAL.) 90 tablet 3   buPROPion (WELLBUTRIN XL) 300 MG 24 hr tablet TAKE 1 TABLET BY MOUTH DAILY**TAKE WITH BUPROPION 150MG  TO EQUAL 450MG  TOTAL. (Patient taking differently: Take 300 mg by  mouth daily. TAKE 1 TABLET BY MOUTH DAILY**TAKE WITH BUPROPION 150MG  TO EQUAL 350MG  TOTAL.) 90 tablet 3   Calcium-Magnesium-Zinc 333-133-5 MG TABS Take 1 tablet by mouth daily.     Cholecalciferol (D3) 25 MCG (1000 UT) capsule Take 1,000 Units by mouth daily.     Cyanocobalamin (B-12 PO) Take 1 tablet by mouth daily.     cyclobenzaprine (FLEXERIL) 10 MG tablet Take 1 tablet (10 mg total) by mouth 3 (three) times daily as needed for muscle spasms. 30 tablet 1   estradiol (ESTRACE) 0.1 MG/GM vaginal cream Place 1 Applicatorful vaginally 3 (three) times a week.     ferrous sulfate 325 (65 FE) MG EC tablet Take 325 mg by mouth 3 (three) times daily with meals.     Fexofenadine HCl (ALLEGRA PO) Take 1 tablet by mouth daily.     fluticasone (FLONASE) 50 MCG/ACT nasal spray Place 2 sprays into both nostrils daily. 16 g 11   GARCINIA CAMBOGIA-CHROMIUM PO Take 1 tablet by mouth daily.     Multiple Vitamin (MULTIVITAMIN WITH MINERALS) TABS tablet Take 1 tablet by mouth daily.     potassium chloride SA (K-DUR,KLOR-CON) 20 MEQ tablet Take 1 tablet (20 mEq total) by mouth daily. 3 tablet 0   promethazine (PHENERGAN) 25 MG suppository Place 1 suppository (25 mg total) rectally every 6 (six) hours as needed for nausea or vomiting. 12 each 0   sertraline (ZOLOFT) 100 MG tablet Take 1 tablet (100 mg total)  by mouth daily. 90 tablet 1   simvastatin (ZOCOR) 10 MG tablet Take 1 tablet (10 mg total) by mouth at bedtime. 90 tablet 3   Turmeric (QC TUMERIC COMPLEX PO) Take 1 tablet by mouth daily.     vitamin E 1000 UNIT capsule Take 1,000 Units by mouth daily.     omega-3 acid ethyl esters (LOVAZA) 1 g capsule Take 1 capsule (1 g total) by mouth daily. (Patient not taking: Reported on 09/15/2022) 30 capsule 6   No facility-administered medications prior to visit.    PAST MEDICAL HISTORY: Past Medical History:  Diagnosis Date   Anxiety    Depression    Hyperlipidemia 03/14/2013    PAST SURGICAL HISTORY: Past  Surgical History:  Procedure Laterality Date   BLEPHAROPLASTY  02/04/2004   COLONOSCOPY  2014   LIPOSUCTION  1999/2009   MASTOPEXY  1989/1999   resty     restylane  02/04/2003    FAMILY HISTORY: Family History  Problem Relation Age of Onset   Cancer Mother        breast CA 2003, and brain CA   Stroke Father 38   Cancer Maternal Grandmother        ovarian   Colon cancer Neg Hx    Pancreatic cancer Neg Hx    Stomach cancer Neg Hx    Rectal cancer Neg Hx    Esophageal cancer Neg Hx     SOCIAL HISTORY: Social History   Socioeconomic History   Marital status: Married    Spouse name: Not on file   Number of children: Not on file   Years of education: Not on file   Highest education level: Not on file  Occupational History   Not on file  Tobacco Use   Smoking status: Former    Current packs/day: 0.00    Average packs/day: 0.3 packs/day for 5.0 years (1.3 ttl pk-yrs)    Types: Cigarettes    Start date: 09/22/1977    Quit date: 09/23/1982    Years since quitting: 40.3   Smokeless tobacco: Never  Vaping Use   Vaping status: Never Used  Substance and Sexual Activity   Alcohol use: Yes    Comment: socialy   Drug use: No   Sexual activity: Yes    Birth control/protection: Post-menopausal  Other Topics Concern   Not on file  Social History Narrative   Cosmetologist.   Exercises at home 3 d /week   Taking care of granparent and mother ill   No children   Social Determinants of Health   Financial Resource Strain: Not on file  Food Insecurity: Not on file  Transportation Needs: Not on file  Physical Activity: Insufficiently Active (05/20/2018)   Exercise Vital Sign    Days of Exercise per Week: 3 days    Minutes of Exercise per Session: 30 min  Stress: Not on file  Social Connections: Not on file  Intimate Partner Violence: Not on file     PHYSICAL EXAM  GENERAL EXAM/CONSTITUTIONAL: Vitals:  Vitals:   01/06/23 1058  BP: 118/76  Pulse: 72  Weight: 121 lb  12.8 oz (55.2 kg)  Height: 5\' 4"  (1.626 m)   Body mass index is 20.91 kg/m. Wt Readings from Last 3 Encounters:  01/06/23 121 lb 12.8 oz (55.2 kg)  12/19/22 125 lb (56.7 kg)  11/26/22 125 lb (56.7 kg)   Patient is in no distress; well developed, nourished and groomed; neck is supple  CARDIOVASCULAR: Examination of carotid arteries is  normal; no carotid bruits Regular rate and rhythm, no murmurs Examination of peripheral vascular system by observation and palpation is normal  EYES: Ophthalmoscopic exam of optic discs and posterior segments is normal; no papilledema or hemorrhages No results found.  MUSCULOSKELETAL: Gait, strength, tone, movements noted in Neurologic exam below  NEUROLOGIC: MENTAL STATUS:      No data to display         awake, alert, oriented to person, place and time recent and remote memory intact normal attention and concentration language fluent, comprehension intact, naming intact fund of knowledge appropriate  CRANIAL NERVE:  2nd - no papilledema on fundoscopic exam 2nd, 3rd, 4th, 6th - pupils equal and reactive to light, visual fields full to confrontation, extraocular muscles intact, no nystagmus 5th - facial sensation symmetric 7th - facial strength symmetric 8th - hearing intact 9th - palate elevates symmetrically, uvula midline 11th - shoulder shrug symmetric 12th - tongue protrusion midline  MOTOR:  normal bulk and tone, full strength in the BUE, BLE  SENSORY:  normal and symmetric to light touch, temperature, vibration  COORDINATION:  finger-nose-finger, fine finger movements normal  REFLEXES:  deep tendon reflexes 1+ and symmetric  GAIT/STATION:  narrow based gait     DIAGNOSTIC DATA (LABS, IMAGING, TESTING) - I reviewed patient records, labs, notes, testing and imaging myself where available.  Lab Results  Component Value Date   WBC 7.7 09/15/2022   HGB 13.9 09/15/2022   HCT 41.6 09/15/2022   MCV 90.0 09/15/2022    PLT 367 09/15/2022      Component Value Date/Time   NA 133 (L) 09/15/2022 1720   K 3.7 09/15/2022 1720   CL 100 09/15/2022 1720   CO2 25 09/15/2022 1720   GLUCOSE 107 (H) 09/15/2022 1720   BUN 15 09/15/2022 1720   CREATININE 0.82 09/15/2022 1720   CREATININE 0.67 05/24/2019 0837   CALCIUM 9.3 09/15/2022 1720   PROT 7.5 09/15/2022 1720   ALBUMIN 4.6 09/15/2022 1720   AST 23 09/15/2022 1720   ALT 25 09/15/2022 1720   ALKPHOS 49 09/15/2022 1720   BILITOT 0.5 09/15/2022 1720   GFRNONAA >60 09/15/2022 1720   GFRNONAA 102 05/11/2017 0907   GFRAA 118 05/11/2017 0907   Lab Results  Component Value Date   CHOL 199 05/24/2019   HDL 60 05/24/2019   LDLCALC 122 (H) 05/24/2019   TRIG 73 05/24/2019   CHOLHDL 3.3 05/24/2019   No results found for: "HGBA1C" No results found for: "VITAMINB12" Lab Results  Component Value Date   TSH 1.00 05/11/2017    09/15/22 MRI brain  1. No acute intracranial process. 2. Mild chronic small-vessel disease.  09/15/22 MRI brain  - No large vessel occlusion, hemodynamically significant stenosis, or aneurysm in the head or neck.    ASSESSMENT AND PLAN  62 y.o. year old female here with:   Dx:  1. Tremor     PLAN:  TRANSIENT TREMOR, SLURRED SPEECH, ATAXIA (lasting ~3pm - 8pm) - unclear etiology; could have been essential tremor / enhanced tremor, then stress reaction as patient was working with clients at the time, leading to slurred speech and balance issues - TIA considered but felt to be less likely - continue lipid mgmt per PCP - monitor symptoms for now  Return for pending if symptoms worsen or fail to improve.    Suanne Marker, MD 01/06/2023, 11:54 AM Certified in Neurology, Neurophysiology and Neuroimaging  [facility] [address], [address] [place], [place] [address] [phone]  

## 2023-09-18 DIAGNOSIS — E7800 Pure hypercholesterolemia, unspecified: Secondary | ICD-10-CM | POA: Diagnosis not present

## 2023-11-10 DIAGNOSIS — N958 Other specified menopausal and perimenopausal disorders: Secondary | ICD-10-CM | POA: Diagnosis not present

## 2023-11-10 DIAGNOSIS — Z1231 Encounter for screening mammogram for malignant neoplasm of breast: Secondary | ICD-10-CM | POA: Diagnosis not present

## 2023-11-10 DIAGNOSIS — Z01419 Encounter for gynecological examination (general) (routine) without abnormal findings: Secondary | ICD-10-CM | POA: Diagnosis not present

## 2023-11-10 DIAGNOSIS — N390 Urinary tract infection, site not specified: Secondary | ICD-10-CM | POA: Diagnosis not present
